# Patient Record
Sex: Female | Born: 1980 | Race: White | Hispanic: No | Smoking: Former smoker
Health system: Southern US, Community
[De-identification: ages and names within clinical notes are randomized; demographics above are authoritative.]

## PROBLEM LIST (undated history)

## (undated) DIAGNOSIS — R12 Heartburn: Secondary | ICD-10-CM

## (undated) DIAGNOSIS — O321XX0 Maternal care for breech presentation, not applicable or unspecified: Secondary | ICD-10-CM

## (undated) DIAGNOSIS — S52501A Unspecified fracture of the lower end of right radius, initial encounter for closed fracture: Secondary | ICD-10-CM

## (undated) HISTORY — PX: DENTAL SURGERY: SHX609

## (undated) HISTORY — PX: APPENDECTOMY: SHX54

---

## 1999-02-03 ENCOUNTER — Other Ambulatory Visit: Admission: RE | Admit: 1999-02-03 | Discharge: 1999-02-03 | Payer: Self-pay | Admitting: Family Medicine

## 1999-10-23 ENCOUNTER — Inpatient Hospital Stay (HOSPITAL_COMMUNITY): Admission: EM | Admit: 1999-10-23 | Discharge: 1999-10-24 | Payer: Self-pay | Admitting: Emergency Medicine

## 2004-01-27 ENCOUNTER — Other Ambulatory Visit: Admission: RE | Admit: 2004-01-27 | Discharge: 2004-01-27 | Payer: Self-pay | Admitting: Family Medicine

## 2004-12-23 ENCOUNTER — Ambulatory Visit (HOSPITAL_COMMUNITY): Admission: RE | Admit: 2004-12-23 | Discharge: 2004-12-23 | Payer: Self-pay | Admitting: Obstetrics and Gynecology

## 2005-06-29 ENCOUNTER — Other Ambulatory Visit: Admission: RE | Admit: 2005-06-29 | Discharge: 2005-06-29 | Payer: Self-pay | Admitting: Obstetrics and Gynecology

## 2005-07-14 ENCOUNTER — Inpatient Hospital Stay (HOSPITAL_COMMUNITY): Admission: RE | Admit: 2005-07-14 | Discharge: 2005-07-17 | Payer: Self-pay | Admitting: Obstetrics and Gynecology

## 2014-03-18 ENCOUNTER — Emergency Department (HOSPITAL_COMMUNITY): Payer: BLUE CROSS/BLUE SHIELD

## 2014-03-18 ENCOUNTER — Encounter (HOSPITAL_COMMUNITY): Payer: Self-pay

## 2014-03-18 ENCOUNTER — Other Ambulatory Visit (HOSPITAL_COMMUNITY): Payer: Self-pay | Admitting: Orthopaedic Surgery

## 2014-03-18 ENCOUNTER — Emergency Department (HOSPITAL_COMMUNITY)
Admission: EM | Admit: 2014-03-18 | Discharge: 2014-03-18 | Disposition: A | Discharge status: Home / Self Care | Payer: BLUE CROSS/BLUE SHIELD | Attending: Emergency Medicine | Admitting: Emergency Medicine

## 2014-03-18 DIAGNOSIS — Y9289 Other specified places as the place of occurrence of the external cause: Secondary | ICD-10-CM | POA: Insufficient documentation

## 2014-03-18 DIAGNOSIS — X58XXXA Exposure to other specified factors, initial encounter: Secondary | ICD-10-CM | POA: Diagnosis not present

## 2014-03-18 DIAGNOSIS — Z87891 Personal history of nicotine dependence: Secondary | ICD-10-CM | POA: Diagnosis not present

## 2014-03-18 DIAGNOSIS — W000XXA Fall on same level due to ice and snow, initial encounter: Secondary | ICD-10-CM | POA: Insufficient documentation

## 2014-03-18 DIAGNOSIS — S6991XA Unspecified injury of right wrist, hand and finger(s), initial encounter: Secondary | ICD-10-CM | POA: Diagnosis present

## 2014-03-18 DIAGNOSIS — Y998 Other external cause status: Secondary | ICD-10-CM | POA: Insufficient documentation

## 2014-03-18 DIAGNOSIS — Y9389 Activity, other specified: Secondary | ICD-10-CM | POA: Insufficient documentation

## 2014-03-18 DIAGNOSIS — S52501A Unspecified fracture of the lower end of right radius, initial encounter for closed fracture: Secondary | ICD-10-CM | POA: Diagnosis not present

## 2014-03-18 MED ORDER — FENTANYL CITRATE 0.05 MG/ML IJ SOLN
100.0000 ug | Freq: Once | INTRAMUSCULAR | Status: DC
  Filled 2014-03-18: qty 2

## 2014-03-18 MED ORDER — PERCOCET 5-325 MG PO TABS: 1.0000 | ORAL_TABLET | Freq: Four times a day (QID) | ORAL | Status: DC | PRN

## 2014-03-18 MED ORDER — FENTANYL CITRATE 0.05 MG/ML IJ SOLN
100.0000 ug | Freq: Once | INTRAMUSCULAR | Status: AC
  Administered 2014-03-18: 100 ug via INTRAVENOUS

## 2014-03-18 NOTE — ED Notes (Signed)
Per EMS- Patient slipped on ice and landed on her right wrist. Patient has an obvious deformity. No LOC or hitting of her head. Patient receiving Fentanyl 100 mcg IV. Pain initially 10/10 and decreased to 6/10.

## 2014-03-18 NOTE — Discharge Instructions (Signed)
Return here as needed. Follow up by calling Dr. Ophelia CharterYates for an appointment for later today. Elevate the wrist.

## 2014-03-18 NOTE — ED Provider Notes (Signed)
CSN: [id number]     Arrival date & time 03/18/14  [id number] History   First MD Initiated Contact with Patient 03/18/14 [phone]     Chief Complaint  Patient presents with  . Fall  . Wrist Injury     (Consider location/radiation/quality/duration/timing/severity/associated sxs/prior Treatment) HPI Patient presents to the emergency department on a fall that occurred prior to arrival.  Patient states she slipped on ice landing on her right wrist.  The patient states that she felt a pop in her wrist.  The patient states that EMS gave her fentanyl and an IV that relieves her pain somewhat.  The patient states that she did not hit her head or lose consciousness.  Patient denies headache, blurred vision, weakness, dizziness, numbness, back pain, neck pain, shortness of breath, lightheadedness, or syncope.  The patient states that movement and palpation make the pain worse. History reviewed. No pertinent past medical history. Past Surgical History  Procedure Laterality Date  . Appendectomy    . Cesarean section    . Dental surgery     Family History  Problem Relation Age of Onset  . Diabetes Father   . Hypertension Mother    History  Substance Use Topics  . Smoking status: Former Games developermoker  . Smokeless tobacco: Never Used  . Alcohol Use: Yes     Comment: rarely   OB History    No data available     Review of Systems  All other systems negative except as documented in the HPI. All pertinent positives and negatives as reviewed in the HPI.  Allergies  Review of patient's allergies indicates no known allergies.  Home Medications   Prior to Admission medications   Medication Sig Start Date End Date Taking? Authorizing Provider  famotidine (PEPCID AC) 10 MG chewable tablet Chew 10 mg by mouth 2 (two) times daily as needed for heartburn.   Yes Historical Provider, MD   BP 91/67 mmHg  Pulse 53  Temp(Src) 97.4 F (36.3 C) (Oral)  Resp 14  Ht 5\' 6"  (1.676 m)  Wt 190 lb (86.183 kg)  BMI 30.68  kg/m2  SpO2 97%  LMP 03/03/2014 Physical Exam  Constitutional: She is oriented to person, place, and time. She appears well-developed and well-nourished. No distress.  HENT:  Head: Normocephalic and atraumatic.  Mouth/Throat: Oropharynx is clear and moist.  Eyes: Pupils are equal, round, and reactive to light.  Cardiovascular: Normal rate, regular rhythm and normal heart sounds.  Exam reveals no gallop and no friction rub.   No murmur heard. Pulmonary/Chest: Effort normal and breath sounds normal. No respiratory distress.  Musculoskeletal:       Right wrist: She exhibits decreased range of motion, tenderness, bony tenderness, swelling and deformity.  Neurological: She is alert and oriented to person, place, and time. She exhibits normal muscle tone. Coordination normal.  Skin: Skin is warm and dry. No rash noted. No erythema.  Nursing note and vitals reviewed.   ED Course  Procedures (including critical care time)  Imaging Review No results found for this or any previous visit. Dg Wrist Complete Right  03/18/2014   CLINICAL DATA:  Acute right wrist pain after falling on ice today. Initial encounter.  EXAM: RIGHT WRIST - COMPLETE 3+ VIEW  COMPARISON:  None.  FINDINGS: Comminuted fracture of the distal right radius with posterior displacement of distal fragments is noted. Intra-articular extension is noted. This appears to be closed and posttraumatic.  IMPRESSION: Comminuted and posteriorly displaced distal right radial fracture.  Electronically Signed   By: Roque Lias M.D.   On: 03/18/2014 10:57   Ct Head Wo Contrast  03/18/2014   CLINICAL DATA:  Patient slipped on ice while walking on the sidewalk. Right wrist pain. No loss of consciousness. Hit head. No head or cervical complaints.  EXAM: CT HEAD WITHOUT CONTRAST  CT CERVICAL SPINE WITHOUT CONTRAST  TECHNIQUE: Multidetector CT imaging of the head and cervical spine was performed following the standard protocol without intravenous  contrast. Multiplanar CT image reconstructions of the cervical spine were also generated.  COMPARISON:  None.  FINDINGS: CT HEAD FINDINGS  There is no evidence of mass effect, midline shift or extra-axial fluid collections. There is no evidence of a space-occupying lesion or intracranial hemorrhage. There is no evidence of a cortical-based area of acute infarction.  The ventricles and sulci are appropriate for the patient's age. The basal cisterns are patent.  Visualized portions of the orbits are unremarkable. The visualized portions of the paranasal sinuses and mastoid air cells are unremarkable.  The osseous structures are unremarkable.  CT CERVICAL SPINE FINDINGS  The alignment is anatomic. The vertebral body heights are maintained. There is loss of the normal cervical lordosis with mild reversal. There is no acute fracture. There is no static listhesis. The prevertebral soft tissues are normal. The intraspinal soft tissues are not fully imaged on this examination due to poor soft tissue contrast, but there is no gross soft tissue abnormality.  The disc spaces are maintained. There is a mild broad-based disc bulge at C6-7.  The visualized portions of the lung apices demonstrate no focal abnormality.  IMPRESSION: 1. No acute intracranial pathology. 2. No acute osseous injury of the cervical spine.   Electronically Signed   By: Elige Ko   On: 03/18/2014 11:03   Ct Cervical Spine Wo Contrast  03/18/2014   CLINICAL DATA:  Patient slipped on ice while walking on the sidewalk. Right wrist pain. No loss of consciousness. Hit head. No head or cervical complaints.  EXAM: CT HEAD WITHOUT CONTRAST  CT CERVICAL SPINE WITHOUT CONTRAST  TECHNIQUE: Multidetector CT imaging of the head and cervical spine was performed following the standard protocol without intravenous contrast. Multiplanar CT image reconstructions of the cervical spine were also generated.  COMPARISON:  None.  FINDINGS: CT HEAD FINDINGS  There is no  evidence of mass effect, midline shift or extra-axial fluid collections. There is no evidence of a space-occupying lesion or intracranial hemorrhage. There is no evidence of a cortical-based area of acute infarction.  The ventricles and sulci are appropriate for the patient's age. The basal cisterns are patent.  Visualized portions of the orbits are unremarkable. The visualized portions of the paranasal sinuses and mastoid air cells are unremarkable.  The osseous structures are unremarkable.  CT CERVICAL SPINE FINDINGS  The alignment is anatomic. The vertebral body heights are maintained. There is loss of the normal cervical lordosis with mild reversal. There is no acute fracture. There is no static listhesis. The prevertebral soft tissues are normal. The intraspinal soft tissues are not fully imaged on this examination due to poor soft tissue contrast, but there is no gross soft tissue abnormality.  The disc spaces are maintained. There is a mild broad-based disc bulge at C6-7.  The visualized portions of the lung apices demonstrate no focal abnormality.  IMPRESSION: 1. No acute intracranial pathology. 2. No acute osseous injury of the cervical spine.   Electronically Signed   By: Elige Ko  On: 03/18/2014 11:03    Patient is placed in a sugar tongue splint.  I spoke with Dr. Ophelia Charter about the patient who will follow-up in his office today.  The patient is advised she will most likely need surgery.  Patient agrees the plan and all questions were answered.  She is also advised to elevate the wrist   Carlyle Dolly, PA-C 03/18/14 1218  Suzi Roots, MD 03/18/14 [phone]

## 2014-03-18 NOTE — ED Notes (Signed)
Bed: WA06 Expected date:  Expected time:  Means of arrival:  Comments: ems 

## 2014-03-20 ENCOUNTER — Encounter (HOSPITAL_COMMUNITY): Payer: Self-pay | Admitting: *Deleted

## 2014-03-20 MED ORDER — CEFAZOLIN SODIUM-DEXTROSE 2-3 GM-% IV SOLR
2.0000 g | INTRAVENOUS | Status: AC
  Administered 2014-03-21: 2 g via INTRAVENOUS
  Filled 2014-03-20: qty 50

## 2014-03-20 NOTE — H&P (Signed)
  [facility]   A Division of [facility]   [address], [place], [place] [address] Telephone: [phone]  Fax: [phone]     PATIENT: Megan [name], Megan [name]   MR#: [id number]  DOB: Nov 09, 1980   Visit Date: 03/18/2014     A 34 year old white female who is a new patient to the office who is being seen for a right distal radius fracture.  Patient states that this morning while [facility] [employer] [facility] [facility], she slipped onto an outstretched right arm.  She had severe pain in the wrist.  She went to the emergency room and x-rays were taken, which showed a comminuted and displaced right distal radius fracture.  She is put in a splint and instructed to follow up in our office.  No other injuries.   PAST MEDICAL/SURGICAL HISTORY:  Appendectomy and C-section.   CURRENT MEDICATIONS:  None listed.   ALLERGIES:  NKDA.   FAMILY HISTORY:  Positive diabetes, hypertension.   SOCIAL HISTORY:  Patient is single, but has a domestic partner.  Is a Consulting civil engineerstudent at [employer].  Quit smoking, but previously had a 15-year history.  Admits rare alcohol consumption.     PHYSICAL EXAMINATION:  Height 5 feet 6 inches.  Weight 190 pounds.  Very pleasant white female, alert and oriented x3 and in no acute distress.  Gait is normal.  Head is normocephalic atraumatic.  Cervical spine unremarkable.  Right upper extremity:  She does have a splint on that is intact.  Wearing a sling also.  She moves fingers well.  Good capillary refill.  Sensation intact.  Skin warm and dry.  No increase in respiratory effort.   RADIOGRAPHS:  [facility].  She has a comminuted displaced distal radius fracture.     ASSESSMENT:  Right distal radius fracture.   PLAN:  Patient advised the best treatment option at this point would be open reduction internal fixation procedure.  Surgical procedure discussed with patient.  All questions answered.   For additional information please see  handwritten notes, reports, orders and prescriptions in this chart.   Electronically Approved by:    Zonia KiefJames Jacobo Moncrief, PA Veverly FellsMark C. Ophelia CharterYates, M.D.    Auto-Authenticated by Zonia KiefJames Kris No, PA  JO/sm DD: 03/18/2014  DT: 03/19/2014

## 2014-03-20 NOTE — Progress Notes (Signed)
Pt denies SOB, chest pain, and being under the care of a cardiologist. Pt denies having a chest x ray and EKG within the last year. Pt denies having a stress test, echo, and cardiac cath. Pt made aware to stop taking Aspirin, vitamins , and herbal medications. Do not take any NSAIDs ie: Ibuprofen, Advil, Naproxen or any medication containing Aspirin. 

## 2014-03-21 ENCOUNTER — Ambulatory Visit (HOSPITAL_COMMUNITY)
Admission: RE | Admit: 2014-03-21 | Discharge: 2014-03-21 | Disposition: A | Discharge status: Home / Self Care | Payer: BLUE CROSS/BLUE SHIELD | Source: Ambulatory Visit | Attending: Orthopaedic Surgery | Admitting: Orthopaedic Surgery

## 2014-03-21 ENCOUNTER — Ambulatory Visit (HOSPITAL_COMMUNITY): Payer: BLUE CROSS/BLUE SHIELD | Admitting: Certified Registered Nurse Anesthetist

## 2014-03-21 ENCOUNTER — Encounter (HOSPITAL_COMMUNITY): Payer: Self-pay | Admitting: *Deleted

## 2014-03-21 ENCOUNTER — Encounter (HOSPITAL_COMMUNITY)
Admission: RE | Disposition: A | Discharge status: Home / Self Care | Payer: Self-pay | Source: Ambulatory Visit | Attending: Orthopaedic Surgery

## 2014-03-21 DIAGNOSIS — Y9389 Activity, other specified: Secondary | ICD-10-CM | POA: Insufficient documentation

## 2014-03-21 DIAGNOSIS — Y998 Other external cause status: Secondary | ICD-10-CM | POA: Diagnosis not present

## 2014-03-21 DIAGNOSIS — W010XXA Fall on same level from slipping, tripping and stumbling without subsequent striking against object, initial encounter: Secondary | ICD-10-CM | POA: Insufficient documentation

## 2014-03-21 DIAGNOSIS — S52591A Other fractures of lower end of right radius, initial encounter for closed fracture: Secondary | ICD-10-CM | POA: Diagnosis not present

## 2014-03-21 DIAGNOSIS — S52501A Unspecified fracture of the lower end of right radius, initial encounter for closed fracture: Secondary | ICD-10-CM | POA: Diagnosis present

## 2014-03-21 DIAGNOSIS — Y92214 College as the place of occurrence of the external cause: Secondary | ICD-10-CM | POA: Diagnosis not present

## 2014-03-21 DIAGNOSIS — Z87891 Personal history of nicotine dependence: Secondary | ICD-10-CM | POA: Insufficient documentation

## 2014-03-21 HISTORY — DX: Heartburn: R12

## 2014-03-21 HISTORY — PX: OPEN REDUCTION INTERNAL FIXATION (ORIF) DISTAL RADIAL FRACTURE: SHX5989

## 2014-03-21 HISTORY — DX: Unspecified fracture of the lower end of right radius, initial encounter for closed fracture: S52.501A

## 2014-03-21 LAB — CBC
HEMATOCRIT: 41.6 % (ref 36.0–46.0)
Hemoglobin: 14.5 g/dL (ref 12.0–15.0)
MCH: 29.8 pg (ref 26.0–34.0)
MCHC: 34.9 g/dL (ref 30.0–36.0)
MCV: 85.6 fL (ref 78.0–100.0)
Platelets: 319 10*3/uL (ref 150–400)
RBC: 4.86 MIL/uL (ref 3.87–5.11)
RDW: 12.8 % (ref 11.5–15.5)
WBC: 7.7 10*3/uL (ref 4.0–10.5)

## 2014-03-21 LAB — COMPREHENSIVE METABOLIC PANEL
ALT: 44 U/L — ABNORMAL HIGH (ref 0–35)
AST: 30 U/L (ref 0–37)
Albumin: 3.7 g/dL (ref 3.5–5.2)
Alkaline Phosphatase: 57 U/L (ref 39–117)
Anion gap: 3 — ABNORMAL LOW (ref 5–15)
BUN: 10 mg/dL (ref 6–23)
CO2: 29 mmol/L (ref 19–32)
Calcium: 9.1 mg/dL (ref 8.4–10.5)
Chloride: 106 mmol/L (ref 96–112)
Creatinine, Ser: 0.89 mg/dL (ref 0.50–1.10)
GFR calc Af Amer: >90 mL/min (ref >90)
GFR calc non Af Amer: 83 mL/min — ABNORMAL LOW (ref >90)
Glucose, Bld: 103 mg/dL — ABNORMAL HIGH (ref 70–99)
POTASSIUM: 4.1 mmol/L (ref 3.5–5.1)
Sodium: 138 mmol/L (ref 135–145)
TOTAL PROTEIN: 7.2 g/dL (ref 6.0–8.3)
Total Bilirubin: 0.7 mg/dL (ref 0.3–1.2)

## 2014-03-21 LAB — HCG, SERUM, QUALITATIVE: Preg, Serum: NEGATIVE

## 2014-03-21 SURGERY — OPEN REDUCTION INTERNAL FIXATION (ORIF) DISTAL RADIUS FRACTURE
Anesthesia: Regional | Laterality: Right

## 2014-03-21 MED ORDER — HYDROMORPHONE HCL 1 MG/ML IJ SOLN
INTRAMUSCULAR | Status: AC
  Administered 2014-03-21: 0.5 mg via INTRAVENOUS
  Filled 2014-03-21: qty 1

## 2014-03-21 MED ORDER — ONDANSETRON HCL 4 MG/2ML IJ SOLN
INTRAMUSCULAR | Status: AC
  Filled 2014-03-21: qty 2

## 2014-03-21 MED ORDER — CALCIUM CARBONATE ANTACID 500 MG PO CHEW
800.0000 mg | CHEWABLE_TABLET | Freq: Once | ORAL | Status: AC
  Administered 2014-03-21: 800 mg via ORAL
  Filled 2014-03-21: qty 4

## 2014-03-21 MED ORDER — FENTANYL CITRATE 0.05 MG/ML IJ SOLN
50.0000 ug | INTRAMUSCULAR | Status: DC | PRN
  Administered 2014-03-21: 100 ug via INTRAVENOUS
  Filled 2014-03-21: qty 2

## 2014-03-21 MED ORDER — MIDAZOLAM HCL 2 MG/2ML IJ SOLN
INTRAMUSCULAR | Status: AC
  Filled 2014-03-21: qty 2

## 2014-03-21 MED ORDER — 0.9 % SODIUM CHLORIDE (POUR BTL) OPTIME
TOPICAL | Status: DC | PRN
  Administered 2014-03-21: 1000 mL

## 2014-03-21 MED ORDER — MIDAZOLAM HCL 5 MG/5ML IJ SOLN
INTRAMUSCULAR | Status: DC | PRN
  Administered 2014-03-21: 1 mg via INTRAVENOUS

## 2014-03-21 MED ORDER — FENTANYL CITRATE 0.05 MG/ML IJ SOLN
INTRAMUSCULAR | Status: DC | PRN
  Administered 2014-03-21: 100 ug via INTRAVENOUS
  Administered 2014-03-21: 50 ug via INTRAVENOUS

## 2014-03-21 MED ORDER — LIDOCAINE HCL (CARDIAC) 20 MG/ML IV SOLN
INTRAVENOUS | Status: AC
  Filled 2014-03-21: qty 5

## 2014-03-21 MED ORDER — BUPIVACAINE-EPINEPHRINE (PF) 0.5% -1:200000 IJ SOLN
INTRAMUSCULAR | Status: DC | PRN
  Administered 2014-03-21: 30 mL via PERINEURAL

## 2014-03-21 MED ORDER — LACTATED RINGERS IV SOLN
INTRAVENOUS | Status: DC
  Administered 2014-03-21: 12:00:00 via INTRAVENOUS

## 2014-03-21 MED ORDER — PROPOFOL 10 MG/ML IV BOLUS
INTRAVENOUS | Status: DC | PRN
  Administered 2014-03-21: 150 mg via INTRAVENOUS

## 2014-03-21 MED ORDER — DEXAMETHASONE SODIUM PHOSPHATE 4 MG/ML IJ SOLN
INTRAMUSCULAR | Status: AC
  Filled 2014-03-21: qty 6

## 2014-03-21 MED ORDER — PROMETHAZINE HCL 25 MG/ML IJ SOLN: 6.2500 mg | INTRAMUSCULAR | Status: DC | PRN

## 2014-03-21 MED ORDER — NEOSTIGMINE METHYLSULFATE 10 MG/10ML IV SOLN
INTRAVENOUS | Status: AC
  Filled 2014-03-21: qty 1

## 2014-03-21 MED ORDER — HYDROMORPHONE HCL 1 MG/ML IJ SOLN
0.2500 mg | INTRAMUSCULAR | Status: DC | PRN
  Administered 2014-03-21 (×2): 0.5 mg via INTRAVENOUS

## 2014-03-21 MED ORDER — MIDAZOLAM HCL 2 MG/2ML IJ SOLN
1.0000 mg | INTRAMUSCULAR | Status: DC | PRN
  Administered 2014-03-21: 1 mg via INTRAVENOUS
  Filled 2014-03-21: qty 2

## 2014-03-21 MED ORDER — LACTATED RINGERS IV SOLN
INTRAVENOUS | Status: DC | PRN
  Administered 2014-03-21 (×2): via INTRAVENOUS

## 2014-03-21 MED ORDER — LIDOCAINE HCL (CARDIAC) 20 MG/ML IV SOLN
INTRAVENOUS | Status: DC | PRN
  Administered 2014-03-21: 100 mg via INTRAVENOUS

## 2014-03-21 MED ORDER — PROPOFOL 10 MG/ML IV BOLUS
INTRAVENOUS | Status: AC
  Filled 2014-03-21: qty 20

## 2014-03-21 MED ORDER — FENTANYL CITRATE 0.05 MG/ML IJ SOLN
INTRAMUSCULAR | Status: AC
  Filled 2014-03-21: qty 5

## 2014-03-21 MED ORDER — SUCCINYLCHOLINE CHLORIDE 20 MG/ML IJ SOLN
INTRAMUSCULAR | Status: AC
  Filled 2014-03-21: qty 1

## 2014-03-21 SURGICAL SUPPLY — 53 items
BANDAGE ELASTIC 3 VELCRO ST LF (GAUZE/BANDAGES/DRESSINGS) ×3 IMPLANT
BANDAGE ELASTIC 4 VELCRO ST LF (GAUZE/BANDAGES/DRESSINGS) ×3 IMPLANT
BIT DRILL 2 FAST STEP (BIT) ×3 IMPLANT
BIT DRILL 2.5X4 QC (BIT) ×3 IMPLANT
BNDG ESMARK 4X9 LF (GAUZE/BANDAGES/DRESSINGS) IMPLANT
CLOSURE WOUND 1/2 X4 (GAUZE/BANDAGES/DRESSINGS) ×1
COVER SURGICAL LIGHT HANDLE (MISCELLANEOUS) ×3 IMPLANT
DRAPE OEC MINIVIEW 54X84 (DRAPES) IMPLANT
DRSG PAD ABDOMINAL 8X10 ST (GAUZE/BANDAGES/DRESSINGS) IMPLANT
DURAPREP 26ML APPLICATOR (WOUND CARE) ×3 IMPLANT
ELECT REM PT RETURN 9FT ADLT (ELECTROSURGICAL) ×3
ELECTRODE REM PT RTRN 9FT ADLT (ELECTROSURGICAL) ×1 IMPLANT
GAUZE SPONGE 4X4 12PLY STRL (GAUZE/BANDAGES/DRESSINGS) IMPLANT
GAUZE XEROFORM 1X8 LF (GAUZE/BANDAGES/DRESSINGS) ×3 IMPLANT
GLOVE BIOGEL PI IND STRL 8 (GLOVE) ×1 IMPLANT
GLOVE BIOGEL PI INDICATOR 8 (GLOVE) ×2
GLOVE ECLIPSE 7.0 STRL STRAW (GLOVE) ×3 IMPLANT
GLOVE ORTHO TXT STRL SZ7.5 (GLOVE) ×3 IMPLANT
GLOVE SURG SS PI 6.0 STRL IVOR (GLOVE) ×3 IMPLANT
GOWN STRL REUS W/ TWL LRG LVL3 (GOWN DISPOSABLE) ×1 IMPLANT
GOWN STRL REUS W/TWL LRG LVL3 (GOWN DISPOSABLE) ×2
K-WIRE 1.6 (WIRE) ×2
K-WIRE FX5X1.6XNS BN SS (WIRE) ×1
KIT BASIN OR (CUSTOM PROCEDURE TRAY) ×3 IMPLANT
KIT ROOM TURNOVER OR (KITS) ×3 IMPLANT
KWIRE FX5X1.6XNS BN SS (WIRE) ×1 IMPLANT
MANIFOLD NEPTUNE II (INSTRUMENTS) ×3 IMPLANT
NEEDLE 22X1 1/2 (OR ONLY) (NEEDLE) IMPLANT
NS IRRIG 1000ML POUR BTL (IV SOLUTION) ×3 IMPLANT
PACK ORTHO EXTREMITY (CUSTOM PROCEDURE TRAY) ×3 IMPLANT
PAD ARMBOARD 7.5X6 YLW CONV (MISCELLANEOUS) ×6 IMPLANT
PAD CAST 4YDX4 CTTN HI CHSV (CAST SUPPLIES) ×2 IMPLANT
PADDING CAST COTTON 4X4 STRL (CAST SUPPLIES) ×4
PEG SUBCHONDRAL SMOOTH 2.0X14 (Peg) ×3 IMPLANT
PEG SUBCHONDRAL SMOOTH 2.0X18 (Peg) ×6 IMPLANT
PEG SUBCHONDRAL SMOOTH 2.0X20 (Peg) ×12 IMPLANT
PLATE STAN 24.4X59.5 RT (Plate) ×3 IMPLANT
SCREW BN 12X3.5XNS CORT TI (Screw) ×2 IMPLANT
SCREW CORT 3.5X12 (Screw) ×4 IMPLANT
SPONGE GAUZE 4X4 12PLY STER LF (GAUZE/BANDAGES/DRESSINGS) ×3 IMPLANT
STRIP CLOSURE SKIN 1/2X4 (GAUZE/BANDAGES/DRESSINGS) ×2 IMPLANT
SUT PROLENE 3 0 PS 1 (SUTURE) ×3 IMPLANT
SUT VIC AB 2-0 CT1 27 (SUTURE)
SUT VIC AB 2-0 CT1 TAPERPNT 27 (SUTURE) IMPLANT
SUT VIC AB 3-0 X1 27 (SUTURE) ×3 IMPLANT
SUT VICRYL 4-0 PS2 18IN ABS (SUTURE) IMPLANT
SYR CONTROL 10ML LL (SYRINGE) IMPLANT
TOWEL OR 17X24 6PK STRL BLUE (TOWEL DISPOSABLE) ×3 IMPLANT
TOWEL OR 17X26 10 PK STRL BLUE (TOWEL DISPOSABLE) ×3 IMPLANT
TUBE CONNECTING 12'X1/4 (SUCTIONS) ×1
TUBE CONNECTING 12X1/4 (SUCTIONS) ×2 IMPLANT
UNDERPAD 30X30 INCONTINENT (UNDERPADS AND DIAPERS) ×3 IMPLANT
WATER STERILE IRR 1000ML POUR (IV SOLUTION) ×3 IMPLANT

## 2014-03-21 NOTE — Transfer of Care (Signed)
Immediate Anesthesia Transfer of Care Note  Patient: Megan [name]  Procedure(s) Performed: Procedure(s): OPEN REDUCTION INTERNAL FIXATION (ORIF) DISTAL RADIAL FRACTURE (Right)  Patient Location: PACU  Anesthesia Type:GA combined with regional for post-op pain  Level of Consciousness: awake, alert , oriented and patient cooperative  Airway & Oxygen Therapy: Patient Spontanous Breathing and Patient connected to nasal cannula oxygen  Post-op Assessment: Report given to RN, Post -op Vital signs reviewed and stable and Patient moving all extremities  Post vital signs: Reviewed and stable  Last Vitals:  Filed Vitals:   03/21/14 1245  BP: 132/63  Pulse: 85  Temp:   Resp: 14    Complications: No apparent anesthesia complications

## 2014-03-21 NOTE — Anesthesia Postprocedure Evaluation (Signed)
Anesthesia Post Note  Patient: Megan FreshwaterMelissa A Michie  Procedure(s) Performed: Procedure(s) (LRB): OPEN REDUCTION INTERNAL FIXATION (ORIF) DISTAL RADIAL FRACTURE (Right)  Anesthesia type: general  Patient location: PACU  Post pain: Pain level controlled  Post assessment: Patient's Cardiovascular Status Stable  Last Vitals:  Filed Vitals:   03/21/14 1639  BP: 141/85  Pulse: 89  Temp:   Resp: 16    Post vital signs: Reviewed and stable  Level of consciousness: sedated  Complications: No apparent anesthesia complications

## 2014-03-21 NOTE — Progress Notes (Signed)
Orthopedic Tech Progress Note Patient Details:  Loralyn FreshwaterMelissa A Wadhwa [birth year] [id number]  Ortho Devices Type of Ortho Device: Arm sling Ortho Device/Splint Location: rue Ortho Device/Splint Interventions: Application   Ranell Finelli 03/21/2014, 4:07 PM

## 2014-03-21 NOTE — Discharge Summary (Signed)
  Patient not admitted. Does not need a discharge summary.

## 2014-03-21 NOTE — Interval H&P Note (Signed)
History and Physical Interval Note:  03/21/2014 12:21 PM  Megan [name]  has presented today for surgery, with the diagnosis of Right Distal Radius Fracture  The various methods of treatment have been discussed with the patient and family. After consideration of risks, benefits and other options for treatment, the patient has consented to  Procedure(s): OPEN REDUCTION INTERNAL FIXATION (ORIF) DISTAL RADIAL FRACTURE (Right) as a surgical intervention .  The patient's history has been reviewed, patient examined, no change in status, stable for surgery.  I have reviewed the patient's chart and labs.  Questions were answered to the patient's satisfaction.     Arlin Savona C

## 2014-03-21 NOTE — Discharge Instructions (Signed)
Hand elevated above heart , sling only when ambulating. Office one week percocet for pain

## 2014-03-21 NOTE — Progress Notes (Signed)
Dr. Jacklynn BueMassagee in to see pt while xray staff in to take pt for CXR. CXR discontinued by Dr. Jacklynn BueMassagee

## 2014-03-21 NOTE — Anesthesia Procedure Notes (Addendum)
Anesthesia Regional Block:  Supraclavicular block  Pre-Anesthetic Checklist: ,, timeout performed, Correct Patient, Correct Site, Correct Laterality, Correct Procedure, Correct Position, site marked, Risks and benefits discussed,  Surgical consent,  Pre-op evaluation,  At surgeon's request and post-op pain management  Laterality: Right and Upper  Prep: chloraprep       Needles:   Needle Type: Echogenic Stimulator Needle     Needle Length: 9cm 9 cm Needle Gauge: 21 and 21 G  Needle insertion depth: 4 cm   Additional Needles:  Procedures: ultrasound guided (picture in chart) and nerve stimulator Supraclavicular block Narrative:  Start time: 03/21/2014 12:10 PM End time: 03/21/2014 12:25 PM Injection made incrementally with aspirations every 5 mL.  Performed by: Personally  Anesthesiologist: MASSAGEE, TERRY  Additional Notes: Tolerated well, no picture available   Procedure Name: LMA Insertion Date/Time: 03/21/2014 2:11 PM Performed by: Rogelia BogaMUELLER, Shwanda Soltis P Pre-anesthesia Checklist: Patient identified, Emergency Drugs available, Suction available, Patient being monitored and Timeout performed Patient Re-evaluated:Patient Re-evaluated prior to inductionOxygen Delivery Method: Circle system utilized Preoxygenation: Pre-oxygenation with 100% oxygen Intubation Type: IV induction LMA Size: 4.0 Number of attempts: 1 Placement Confirmation: positive ETCO2 and breath sounds checked- equal and bilateral Tube secured with: Tape Dental Injury: Teeth and Oropharynx as per pre-operative assessment

## 2014-03-21 NOTE — Anesthesia Preprocedure Evaluation (Signed)
Anesthesia Evaluation  Patient identified by MRN, date of birth, ID band Patient awake    Reviewed: Allergy & Precautions, NPO status , Patient's Chart, lab work & pertinent test results  History of Anesthesia Complications Negative for: history of anesthetic complications  Airway Mallampati: I       Dental  (+) Teeth Intact   Pulmonary neg pulmonary ROS, former smoker,  breath sounds clear to auscultation        Cardiovascular negative cardio ROS  Rhythm:Regular Rate:Normal     Neuro/Psych    GI/Hepatic negative GI ROS, Neg liver ROS,   Endo/Other  negative endocrine ROS  Renal/GU negative Renal ROS     Musculoskeletal   Abdominal   Peds  Hematology negative hematology ROS (+)   Anesthesia Other Findings   Reproductive/Obstetrics                             Anesthesia Physical Anesthesia Plan  ASA: I  Anesthesia Plan: General   Post-op Pain Management:    Induction: Intravenous  Airway Management Planned: LMA  Additional Equipment:   Intra-op Plan:   Post-operative Plan: Extubation in OR  Informed Consent: I have reviewed the patients History and Physical, chart, labs and discussed the procedure including the risks, benefits and alternatives for the proposed anesthesia with the patient or authorized representative who has indicated his/her understanding and acceptance.     Plan Discussed with: CRNA and Surgeon  Anesthesia Plan Comments:         Anesthesia Quick Evaluation

## 2014-03-21 NOTE — Brief Op Note (Signed)
03/21/2014  3:10 PM  PATIENT:  Megan [name]  34 y.o. female  PRE-OPERATIVE DIAGNOSIS:  Right Distal Radius Fracture  POST-OPERATIVE DIAGNOSIS:  Right Distal Radius Fracture  PROCEDURE:  Procedure(s): OPEN REDUCTION INTERNAL FIXATION (ORIF) DISTAL RADIAL FRACTURE (Right)  SURGEON:  Surgeon(s) and Role:    * Eldred MangesMark C Holleigh Crihfield, MD - Primary  PHYSICIAN ASSISTANT:   ASSISTANTS: none   ANESTHESIA:   regional and general  EBL:  Total I/O In: 1000 [I.V.:1000] Out: -   BLOOD ADMINISTERED:none  DRAINS: none   LOCAL MEDICATIONS USED:  NONE  SPECIMEN:  No Specimen  DISPOSITION OF SPECIMEN:  N/A  COUNTS:  YES  TOURNIQUET:   Total Tourniquet Time Documented: Upper Arm (Right) - 29 minutes Total: Upper Arm (Right) - 29 minutes   DICTATION: .Other Dictation: Dictation Number [id number]  PLAN OF CARE: Discharge to home after PACU  PATIENT DISPOSITION:  PACU - hemodynamically stable.   Delay start of Pharmacological VTE agent (>24hrs) due to surgical blood loss or risk of bleeding: not applicable

## 2014-03-22 NOTE — Op Note (Signed)
[name]:  [name], Megan             ACCOUNT NO.:  [id number]  MEDICAL RECORD NO.:  [id number]  LOCATION:  [facility]                         FACILITY:  [facility]  PHYSICIAN:  Hilda Wexler C. Ophelia CharterYates, M.D.    DATE OF BIRTH:  10/16/1980  DATE OF PROCEDURE:  03/21/2014 DATE OF DISCHARGE:  03/21/2014                              OPERATIVE REPORT   PREOPERATIVE DIAGNOSIS:  Right distal radius fracture.  POSTOPERATIVE DIAGNOSIS:  Right distal radius fracture.  PROCEDURE:  Open reduction and internal fixation of right distal radius volar DVR plating.  SURGEON:  Livianna Petraglia C. Ophelia CharterYates, MD  ANESTHESIA:  Regional plus general.  TOURNIQUET TIME:  29 minutes.  DRAINS:  None.  BRIEF HISTORY:  A 34 year old female fell with a severely displaced distal radius fracture angulation and displacement.  Extension into the distal radial ulnar joint with 2 distal intra-articular fragments plus the proximal fragment.  DESCRIPTION OF PROCEDURE:  After preoperative block, standard prepping and draping with general anesthesia, proximal arm tourniquet had been applied.  DuraPrep, usual extremity sheets and drapes, stockinette had been applied.  Time-out procedure.  Incision was made after sterile skin marker was used, wrapping with an Esmarch, and tourniquet inflation at 250.  Incision was made directly over the FCR posterior sheath, was divided after pulling the FCR towards the radial artery for protection. Posterior sheath was opened and the pronator was pulled off the radial aspect of the radius towards the ulna exposing the fracture site and volar surface of the distal radius.  Standard right plate was selected. Fracture was reduced with distraction displacing the fragment and then reducing it in anatomic position.  Plate was applied.  K-wire was placed in the whole midportion of the plate and then checked under fluoroscopy. Second pin was then placed through the proximal hole, small pins through the small K-wire hole which  confirmed that was in good position.  Would not violate the joint and all pins and pegs would catch the distal fragment holding it reduced.  Proximal 2 screws were placed, purple 12 mm screws after drilling depth gauge measurement.  Smooth pegs were placed starting distal row going from the ulnar to the radial aspect and then filling the proximal peg rolls all with smooth pegs.  Final spot pictures were taken, AP, lateral, and tangential views confirming anatomic position, plate and screws were in good position.  All screws were locked down.  Irrigation with saline solution, tourniquet deflation. 2-0 Vicryl in subcutaneous tissue, 3-0 Vicryl subcuticular closure.  Tincture of benzoin, Steri-Strips, postop splint, tweeners, 4x4s, Webril, dorsal splint, more Webril, and Ace wrap was applied.  The patient will be discharged home since she has had an excellent block.     Tysheem Accardo C. Ophelia CharterYates, M.D.     MCY/MEDQ  D:  03/21/2014  T:  03/22/2014  Job:  [id number]

## 2014-03-24 ENCOUNTER — Encounter (HOSPITAL_COMMUNITY): Payer: Self-pay | Admitting: Orthopaedic Surgery

## 2014-05-12 ENCOUNTER — Inpatient Hospital Stay (HOSPITAL_COMMUNITY)
Admission: EM | Admit: 2014-05-12 | Discharge: 2014-05-12 | Disposition: A | Discharge status: Home / Self Care | Payer: BLUE CROSS/BLUE SHIELD | Attending: Obstetrics and Gynecology | Admitting: Obstetrics and Gynecology

## 2014-05-12 ENCOUNTER — Encounter (HOSPITAL_COMMUNITY): Payer: Self-pay

## 2014-05-12 ENCOUNTER — Inpatient Hospital Stay (HOSPITAL_COMMUNITY): Payer: BLUE CROSS/BLUE SHIELD

## 2014-05-12 DIAGNOSIS — R55 Syncope and collapse: Secondary | ICD-10-CM

## 2014-05-12 DIAGNOSIS — O039 Complete or unspecified spontaneous abortion without complication: Secondary | ICD-10-CM

## 2014-05-12 DIAGNOSIS — R42 Dizziness and giddiness: Secondary | ICD-10-CM | POA: Insufficient documentation

## 2014-05-12 DIAGNOSIS — O209 Hemorrhage in early pregnancy, unspecified: Secondary | ICD-10-CM

## 2014-05-12 DIAGNOSIS — O26899 Other specified pregnancy related conditions, unspecified trimester: Secondary | ICD-10-CM

## 2014-05-12 DIAGNOSIS — Z87891 Personal history of nicotine dependence: Secondary | ICD-10-CM | POA: Diagnosis not present

## 2014-05-12 DIAGNOSIS — Z3A10 10 weeks gestation of pregnancy: Secondary | ICD-10-CM | POA: Insufficient documentation

## 2014-05-12 DIAGNOSIS — R109 Unspecified abdominal pain: Secondary | ICD-10-CM

## 2014-05-12 DIAGNOSIS — N939 Abnormal uterine and vaginal bleeding, unspecified: Secondary | ICD-10-CM

## 2014-05-12 LAB — HIV ANTIBODY (ROUTINE TESTING W REFLEX)
HIV Screen 4th Generation wRfx: NONREACTIVE
HIV Screen 4th Generation wRfx: NONREACTIVE

## 2014-05-12 LAB — CBC
HCT: 29.7 % — ABNORMAL LOW (ref 36.0–46.0)
HCT: 27.6 % — ABNORMAL LOW (ref 36.0–46.0)
Hemoglobin: 10.3 g/dL — ABNORMAL LOW (ref 12.0–15.0)
Hemoglobin: 9.7 g/dL — ABNORMAL LOW (ref 12.0–15.0)
MCH: 29.9 pg (ref 26.0–34.0)
MCH: 30.4 pg (ref 26.0–34.0)
MCHC: 34.7 g/dL (ref 30.0–36.0)
MCHC: 35.1 g/dL (ref 30.0–36.0)
MCV: 86.3 fL (ref 78.0–100.0)
MCV: 86.5 fL (ref 78.0–100.0)
PLATELETS: 336 10*3/uL (ref 150–400)
Platelets: 326 10*3/uL (ref 150–400)
RBC: 3.44 MIL/uL — ABNORMAL LOW (ref 3.87–5.11)
RBC: 3.19 MIL/uL — ABNORMAL LOW (ref 3.87–5.11)
RDW: 13.1 % (ref 11.5–15.5)
RDW: 13.2 % (ref 11.5–15.5)
WBC: 15.1 10*3/uL — AB (ref 4.0–10.5)
WBC: 16.2 10*3/uL — AB (ref 4.0–10.5)

## 2014-05-12 LAB — CBC WITH DIFFERENTIAL/PLATELET
BASOS ABS: 0.1 10*3/uL (ref 0.0–0.1)
Basophils Relative: 0 % (ref 0–1)
Eosinophils Absolute: 0.1 10*3/uL (ref 0.0–0.7)
Eosinophils Relative: 0 % (ref 0–5)
HCT: 34.5 % — ABNORMAL LOW (ref 36.0–46.0)
Hemoglobin: 12 g/dL (ref 12.0–15.0)
Lymphocytes Relative: 12 % (ref 12–46)
Lymphs Abs: 2.8 10*3/uL (ref 0.7–4.0)
MCH: 30.4 pg (ref 26.0–34.0)
MCHC: 34.8 g/dL (ref 30.0–36.0)
MCV: 87.3 fL (ref 78.0–100.0)
Monocytes Absolute: 1 10*3/uL (ref 0.1–1.0)
Monocytes Relative: 4 % (ref 3–12)
Neutro Abs: 20 10*3/uL — ABNORMAL HIGH (ref 1.7–7.7)
Neutrophils Relative %: 84 % — ABNORMAL HIGH (ref 43–77)
PLATELETS: 429 10*3/uL — AB (ref 150–400)
RBC: 3.95 MIL/uL (ref 3.87–5.11)
RDW: 13.1 % (ref 11.5–15.5)
WBC: 24 10*3/uL — AB (ref 4.0–10.5)

## 2014-05-12 LAB — SAMPLE TO BLOOD BANK

## 2014-05-12 LAB — I-STAT BETA HCG BLOOD, ED (MC, WL, AP ONLY)

## 2014-05-12 LAB — BASIC METABOLIC PANEL
ANION GAP: 9 (ref 5–15)
BUN: 16 mg/dL (ref 6–23)
CHLORIDE: 102 mmol/L (ref 96–112)
CO2: 23 mmol/L (ref 19–32)
CREATININE: 0.89 mg/dL (ref 0.50–1.10)
Calcium: 8.8 mg/dL (ref 8.4–10.5)
GFR calc non Af Amer: 83 mL/min — ABNORMAL LOW (ref >90)
Glucose, Bld: 131 mg/dL — ABNORMAL HIGH (ref 70–99)
Potassium: 3.3 mmol/L — ABNORMAL LOW (ref 3.5–5.1)
SODIUM: 134 mmol/L — AB (ref 135–145)

## 2014-05-12 LAB — WET PREP, GENITAL
Clue Cells Wet Prep HPF POC: NONE SEEN
Trich, Wet Prep: NONE SEEN
Yeast Wet Prep HPF POC: NONE SEEN

## 2014-05-12 LAB — TYPE AND SCREEN
ABO/RH(D): A POS
Antibody Screen: NEGATIVE

## 2014-05-12 LAB — HCG, QUANTITATIVE, PREGNANCY: HCG, BETA CHAIN, QUANT, S: 4517 m[IU]/mL — AB (ref <5)

## 2014-05-12 LAB — OB RESULTS CONSOLE GBS: GBS: NEGATIVE

## 2014-05-12 LAB — ABO/RH: ABO/RH(D): A POS

## 2014-05-12 MED ORDER — SODIUM CHLORIDE 0.9 % IV BOLUS (SEPSIS)
1000.0000 mL | Freq: Once | INTRAVENOUS | Status: AC
  Administered 2014-05-12: 1000 mL via INTRAVENOUS

## 2014-05-12 MED ORDER — MISOPROSTOL 200 MCG PO TABS
800.0000 ug | ORAL_TABLET | Freq: Once | ORAL | Status: AC
  Administered 2014-05-12: 800 ug via RECTAL
  Filled 2014-05-12: qty 4

## 2014-05-12 MED ORDER — HYDROCODONE-ACETAMINOPHEN 5-325 MG PO TABS: 1.0000 | ORAL_TABLET | ORAL | Status: DC | PRN

## 2014-05-12 MED ORDER — ONDANSETRON HCL 4 MG/2ML IJ SOLN
4.0000 mg | Freq: Once | INTRAMUSCULAR | Status: AC
  Administered 2014-05-12: 4 mg via INTRAVENOUS
  Filled 2014-05-12: qty 2

## 2014-05-12 MED ORDER — SODIUM CHLORIDE 0.9 % IV SOLN: INTRAVENOUS | Status: DC

## 2014-05-12 MED ORDER — HYDROMORPHONE HCL 1 MG/ML IJ SOLN
1.0000 mg | Freq: Once | INTRAMUSCULAR | Status: AC
  Administered 2014-05-12: 1 mg via INTRAVENOUS
  Filled 2014-05-12: qty 1

## 2014-05-12 MED ORDER — MISOPROSTOL 200 MCG PO TABS: 800.0000 ug | ORAL_TABLET | Freq: Once | ORAL | Status: DC

## 2014-05-12 MED ORDER — ONDANSETRON 4 MG PO TBDP: 4.0000 mg | ORAL_TABLET | Freq: Three times a day (TID) | ORAL | Status: DC | PRN

## 2014-05-12 NOTE — ED Provider Notes (Signed)
CSN: [id number]     Arrival date & time 05/12/14  0230 History   First MD Initiated Contact with Patient 05/12/14 0245     Chief Complaint  Patient presents with  . Vaginal Bleeding     (Consider location/radiation/quality/duration/timing/severity/associated sxs/prior Treatment) HPI 34 year old female presents to the emergency department from home with complaint of heavy vaginal bleeding.  Bleeding started yesterday morning around 11 AM.  She reports that she has been going through 2 tampons an hour.  Patient woke up around 1 AM and on the way to the bathroom, felt very dizzy, lightheaded and hot.  She had nausea and vomiting.  Patient was helped to the bathroom by her husband, and afterwards got into the tub.  By her description it sounds that she threw up and then passed out in the tub.  Patient reports her last normal menstrual period was in January.  Patient had a fracture of her right arm in surgery, and attributed her missed period in February 2 stressors of the surgery.  She reports she had some light spotting 2 weeks ago.  She reports she has had weight gain, which she attributed to not moving around much.  After the surgery.  Patient reports one prior pregnancy, no complications.  She reports she has had some lower abdominal cramping which are slightly worse, and menstrual cramps. Past Medical History  Diagnosis Date  . Distal radius fracture, right   . Heartburn    Past Surgical History  Procedure Laterality Date  . Appendectomy    . Cesarean section    . Dental surgery    . Open reduction internal fixation (orif) distal radial fracture Right 03/21/2014    Procedure: OPEN REDUCTION INTERNAL FIXATION (ORIF) DISTAL RADIAL FRACTURE;  Surgeon: Eldred MangesMark C Yates, MD;  Location: [facility] OR;  Service: Orthopedics;  Laterality: Right;   Family History  Problem Relation Age of Onset  . Diabetes Father   . Hypertension Mother    History  Substance Use Topics  . Smoking status: Former Smoker   Types: Cigarettes  . Smokeless tobacco: Never Used     Comment: Quit December 2015  . Alcohol Use: Yes     Comment: rarely   OB History    No data available     Review of Systems  See History of Present Illness; otherwise all other systems are reviewed and negative   Allergies  Review of patient's allergies indicates no known allergies.  Home Medications   Prior to Admission medications   Medication Sig Start Date End Date Taking? Authorizing Provider  famotidine (PEPCID AC) 10 MG chewable tablet Chew 10 mg by mouth 2 (two) times daily as needed for heartburn.   Yes Historical Provider, MD  naproxen sodium (ANAPROX) 220 MG tablet Take 220 mg by mouth once.   Yes Historical Provider, MD  PERCOCET 5-325 MG per tablet Take 1 tablet by mouth every 6 (six) hours as needed for severe pain. Patient not taking: Reported on 05/12/2014 03/18/14   Charlestine Nighthristopher Lawyer, PA-C   BP 110/69 mmHg  Pulse 112  Temp(Src) 97.4 F (36.3 C) (Oral)  Resp 12  SpO2 98%  LMP 05/12/2014 Physical Exam  Constitutional: She is oriented to person, place, and time. She appears well-developed and well-nourished. She appears distressed.  HENT:  Head: Normocephalic and atraumatic.  Nose: Nose normal.  Mouth/Throat: Oropharynx is clear and moist.  Eyes: Conjunctivae and EOM are normal. Pupils are equal, round, and reactive to light.  Neck: Normal range of  motion. Neck supple. No JVD present. No tracheal deviation present. No thyromegaly present.  Cardiovascular: Normal rate, regular rhythm, normal heart sounds and intact distal pulses.  Exam reveals no gallop and no friction rub.   No murmur heard. Pulmonary/Chest: Effort normal and breath sounds normal. No stridor. No respiratory distress. She has no wheezes. She has no rales. She exhibits no tenderness.  Abdominal: Soft. Bowel sounds are normal. She exhibits no distension and no mass. There is no tenderness. There is no rebound and no guarding.  Genitourinary:   External genitalia within normal limits Vagina heavy vaginal bleeding with clots Cervix  os is open with large clot in the office bleeding continuously around the clot, filling the vagina  negative for cervical motion tenderness.  Diffuse tenderness with palpation    Musculoskeletal: Normal range of motion. She exhibits no edema or tenderness.  Lymphadenopathy:    She has no cervical adenopathy.  Neurological: She is alert and oriented to person, place, and time. She displays normal reflexes. She exhibits normal muscle tone. Coordination normal.  Skin: Skin is warm and dry. No rash noted. No erythema. No pallor.  Psychiatric: She has a normal mood and affect. Her behavior is normal. Judgment and thought content normal.  Nursing note and vitals reviewed.   ED Course  Procedures (including critical care time) Labs Review Labs Reviewed  CBC WITH DIFFERENTIAL/PLATELET - Abnormal; Notable for the following:    WBC 24.0 (*)    HCT 34.5 (*)    Platelets 429 (*)    Neutrophils Relative % 84 (*)    Neutro Abs 20.0 (*)    All other components within normal limits  BASIC METABOLIC PANEL - Abnormal; Notable for the following:    Sodium 134 (*)    Potassium 3.3 (*)    Glucose, Bld 131 (*)    GFR calc non Af Amer 83 (*)    All other components within normal limits  I-STAT BETA HCG BLOOD, ED ([facility], [facility], [facility] ONLY) - Abnormal; Notable for the following:    I-stat hCG, quantitative >2000.0 (*)    All other components within normal limits  URINALYSIS, ROUTINE W REFLEX MICROSCOPIC  HIV ANTIBODY (ROUTINE TESTING)  HCG, QUANTITATIVE, PREGNANCY  SAMPLE TO BLOOD BANK  GC/CHLAMYDIA PROBE AMP ([place])    Imaging Review No results found.   EKG Interpretation None     CRITICAL CARE Performed by: Olivia Mackie Total critical care time: 60 min Critical care time was exclusive of separately billable procedures and treating other patients. Critical care was necessary to treat or prevent  imminent or life-threatening deterioration. Critical care was time spent personally by me on the following activities: development of treatment plan with patient and/or surrogate as well as nursing, discussions with consultants, evaluation of patient's response to treatment, examination of patient, obtaining history from patient or surrogate, ordering and performing treatments and interventions, ordering and review of laboratory studies, ordering and review of radiographic studies, pulse oximetry and re-evaluation of patient's condition.  MDM   Final diagnoses:  Vaginal bleeding in pregnancy, first trimester  Syncope, unspecified syncope type  Episode of heavy vaginal bleeding    34 year old female with heavy vaginal bleeding.  Her beta hCG is positive, greater than 2000.  Bleeding from miscarriage versus ectopic pregnancy.  Patient is significantly orthostatic.  Case discussed with Dr. Jolayne Panther, on call for OB and she accepts the patient in transfer over to women's MAU.  Patient's hemoglobin at this time is 12.  She is  receiving IV fluids.  Patient updated on findings and plan.    Marisa Severin, MD 05/12/14 [phone]

## 2014-05-12 NOTE — MAU Note (Signed)
Heavy vaginal bleeding since Saturday evening. LMP 03/03/14. Didn't know she was pregnant. Mild cramping this weekend.

## 2014-05-12 NOTE — ED Notes (Signed)
Attempted to obtain a urine specimen from pt however specimen had several large blood clots remaining urine had frank bright red blood.

## 2014-05-12 NOTE — MAU Provider Note (Signed)
History     CSN: [id number]  Arrival date and time: 05/12/14 0230   First Provider Initiated Contact with Patient 05/12/14 [phone]      Chief Complaint  Patient presents with  . Vaginal Bleeding   HPI [name] is a 34 y.o. G3P1011 at suspected [redacted]w[redacted]d pregnancy by LMP here with report of vaginal bleeding that started Saturday that she felt was her menstrual period.  Bleeding increased today and patient reports saturating multiple tampons and passing large clots; the bleeding is described as heavy.  Patient states that she loss consciousness while in the shower.  Went to [facility] where blood was drawn and it was discovered she was pregnant.  Patient surprised by pregnancy, not planned.  Pt sent to [facility] for further evaluation.  Pt reports feeling some lower abdominal cramping, but it's "not that bad".  None at this time. She has not taken anything for the pain.   Patient's last menstrual period was 03/03/2014 (exact date). Condoms for birth control method.    Past Medical History  Diagnosis Date  . Distal radius fracture, right   . Heartburn     Past Surgical History  Procedure Laterality Date  . Appendectomy    . Cesarean section    . Dental surgery    . Open reduction internal fixation (orif) distal radial fracture Right 03/21/2014    Procedure: OPEN REDUCTION INTERNAL FIXATION (ORIF) DISTAL RADIAL FRACTURE;  Surgeon: Eldred Manges, MD;  Location: [facility] OR;  Service: Orthopedics;  Laterality: Right;    Family History  Problem Relation Age of Onset  . Diabetes Father   . Hypertension Mother     History  Substance Use Topics  . Smoking status: Former Smoker    Types: Cigarettes  . Smokeless tobacco: Never Used     Comment: Quit December 2015  . Alcohol Use: Yes     Comment: rarely    Allergies: No Known Allergies  Prescriptions prior to admission  Medication Sig Dispense Refill Last Dose  . famotidine (PEPCID AC) 10 MG chewable tablet Chew 10  mg by mouth 2 (two) times daily as needed for heartburn.   Past Month at Unknown time  . naproxen sodium (ANAPROX) 220 MG tablet Take 220 mg by mouth once.   05/11/2014 at Unknown time    Review of Systems  Constitutional: Negative for fever and chills.  Cardiovascular: Negative for palpitations.  Gastrointestinal: Positive for nausea, vomiting and abdominal pain (cramping).  Genitourinary:       Vaginal bleeding  Neurological: Positive for dizziness and loss of consciousness.  All other systems reviewed and are negative.  Physical Exam   Blood pressure 128/80, pulse 112, temperature 98.7 F (37.1 C), temperature source Oral, resp. rate 16, height  (1.676 m), weight 88.451 kg (195 lb), last menstrual period 03/03/2014, SpO2 99 %.  Physical Exam  Constitutional: She is oriented to person, place, and time. She appears well-developed and well-nourished.  HENT:  Head: Normocephalic.  Neck: Normal range of motion. Neck supple.  Cardiovascular: Regular rhythm and normal heart sounds.  Exam reveals no gallop and no friction rub.   No murmur heard. Respiratory: Effort normal and breath sounds normal. No respiratory distress.  GI: Soft. There is no tenderness (suprapubic).  Genitourinary: There is bleeding (moderate; +clots in vagina) in the vagina.  Neurological: She is alert and oriented to person, place, and time.  Skin: Skin is warm and dry.    MAU Course  Procedures  Results for orders placed or performed during the hospital encounter of 05/12/14 (from the past 24 hour(s))  OB RESULT CONSOLE Group B Strep     Status: None   Collection Time: 05/12/14 12:00 AM  Result Value Ref Range   GBS Negative   CBC with Differential/Platelet     Status: Abnormal   Collection Time: 05/12/14  3:45 AM  Result Value Ref Range   WBC 24.0 (H) 4.0 - 10.5 K/uL   RBC 3.95 3.87 - 5.11 MIL/uL   Hemoglobin 12.0 12.0 - 15.0 g/dL   HCT 96.2 (L) 95.2 - 84.1 %   MCV 87.3 78.0 - 100.0 fL   MCH 30.4  26.0 - 34.0 pg   MCHC 34.8 30.0 - 36.0 g/dL   RDW 32.4 40.1 - 02.7 %   Platelets 429 (H) 150 - 400 K/uL   Neutrophils Relative % 84 (H) 43 - 77 %   Neutro Abs 20.0 (H) 1.7 - 7.7 K/uL   Lymphocytes Relative 12 12 - 46 %   Lymphs Abs 2.8 0.7 - 4.0 K/uL   Monocytes Relative 4 3 - 12 %   Monocytes Absolute 1.0 0.1 - 1.0 K/uL   Eosinophils Relative 0 0 - 5 %   Eosinophils Absolute 0.1 0.0 - 0.7 K/uL   Basophils Relative 0 0 - 1 %   Basophils Absolute 0.1 0.0 - 0.1 K/uL  Basic metabolic panel     Status: Abnormal   Collection Time: 05/12/14  3:45 AM  Result Value Ref Range   Sodium 134 (L) 135 - 145 mmol/L   Potassium 3.3 (L) 3.5 - 5.1 mmol/L   Chloride 102 96 - 112 mmol/L   CO2 23 19 - 32 mmol/L   Glucose, Bld 131 (H) 70 - 99 mg/dL   BUN 16 6 - 23 mg/dL   Creatinine, Ser 2.53 0.50 - 1.10 mg/dL   Calcium 8.8 8.4 - 66.4 mg/dL   GFR calc non Af Amer 83 (L) >90 mL/min   GFR calc Af Amer >90 >90 mL/min   Anion gap 9 5 - 15  HIV antibody     Status: None   Collection Time: 05/12/14  3:45 AM  Result Value Ref Range   HIV Screen 4th Generation wRfx Non Reactive Non Reactive  hCG, quantitative, pregnancy     Status: Abnormal   Collection Time: 05/12/14  3:45 AM  Result Value Ref Range   hCG, Beta Chain, Quant, S 4517 (H) <5 mIU/mL  I-Stat beta hCG blood, ED     Status: Abnormal   Collection Time: 05/12/14  3:51 AM  Result Value Ref Range   I-stat hCG, quantitative >2000.0 (H) <5 mIU/mL   Comment 3          Sample to Blood Bank     Status: None   Collection Time: 05/12/14  4:35 AM  Result Value Ref Range   Blood Bank Specimen SAMPLE AVAILABLE FOR TESTING    Sample Expiration 05/15/2014   Wet prep, genital     Status: Abnormal   Collection Time: 05/12/14  6:40 AM  Result Value Ref Range   Yeast Wet Prep HPF POC NONE SEEN NONE SEEN   Trich, Wet Prep NONE SEEN NONE SEEN   Clue Cells Wet Prep HPF POC NONE SEEN NONE SEEN   WBC, Wet Prep HPF POC FEW (A) NONE SEEN  Type and screen      Status: None   Collection Time: 05/12/14  6:50 AM  Result Value  Ref Range   ABO/RH(D) A POS    Antibody Screen NEG    Sample Expiration 05/15/2014   HIV antibody     Status: None   Collection Time: 05/12/14  6:50 AM  Result Value Ref Range   HIV Screen 4th Generation wRfx Non Reactive Non Reactive  CBC     Status: Abnormal   Collection Time: 05/12/14  6:50 AM  Result Value Ref Range   WBC 15.1 (H) 4.0 - 10.5 K/uL   RBC 3.44 (L) 3.87 - 5.11 MIL/uL   Hemoglobin 10.3 (L) 12.0 - 15.0 g/dL   HCT 13.029.7 (L) 86.536.0 - 78.446.0 %   MCV 86.3 78.0 - 100.0 fL   MCH 29.9 26.0 - 34.0 pg   MCHC 34.7 30.0 - 36.0 g/dL   RDW 69.613.1 29.511.5 - 28.415.5 %   Platelets 326 150 - 400 K/uL  ABO/Rh     Status: None   Collection Time: 05/12/14  6:50 AM  Result Value Ref Range   ABO/RH(D) A POS   CBC     Status: Abnormal   Collection Time: 05/12/14 10:50 AM  Result Value Ref Range   WBC 16.2 (H) 4.0 - 10.5 K/uL   RBC 3.19 (L) 3.87 - 5.11 MIL/uL   Hemoglobin 9.7 (L) 12.0 - 15.0 g/dL   HCT 13.227.6 (L) 44.036.0 - 10.246.0 %   MCV 86.5 78.0 - 100.0 fL   MCH 30.4 26.0 - 34.0 pg   MCHC 35.1 30.0 - 36.0 g/dL   RDW 72.513.2 36.611.5 - 44.015.5 %   Platelets 336 150 - 400 K/uL   Ultrasound: IMPRESSION: No evidence of intrauterine gestation. There is a small amount of complex fluid within the endometrial canal.  No hypervascular adnexal mass identified.  The differential considerations include failed intrauterine pregnancy or very early intrauterine pregnancy. Given the positive beta HCG, an early ectopic pregnancy cannot be excluded. Recommend serial quantitative beta hCGs. If clinically warranted, repeat transvaginal imaging could be performed in 7-10 days.  0820 Pt reports continued dizziness.   [id number] Consulted with Dr. Jolayne Pantheronstant > reviewed HPI/exam/labs/vital > give cytotec 800 mcg rectallly and reassess  0830 Report given to J. Rasch who assumes care of patient.  Eino FarberWalidah Kennith GainN Karim, CNM   0845:Patient reports increased pain and nausea>  dilaudid 1 mg given IV and Zofran 4 mg IV given.  Cytotec placed by RN; consent signed   1030: patient up to the bathroom; unable to walk back to room due to dizziness. Dr. Erin FullingHarraway-Smith made aware> Repeat spec exam and CBC   11:30 small-moderate amount of tissue like material pulled from cervical os with kelly clamp. After 30 mins no blood was noted on patient's pad. Dr. Erin FullingHarraway-Smith notified; vitals stable and patient is not having any pain at this time.   Repeat hemoglobin 9.7  Assessment and Plan   A:  SAB in process.  Vaginal bleeding in pregnancy   P:  Discharge home in stable condition RX: Cytotec- patient to take in 8 hours        Zofran         vicodin  Follow up in the clinic on Friday for repeat beta hcg level Bleeding precautions  Specimen sent to pathology of possible POC  Return to MAU if symptoms worsen  Pelvic rest  Duane LopeJennifer I Rasch, NP 05/12/2014 3:38 PM

## 2014-05-12 NOTE — Progress Notes (Signed)
Patient states she feels like she is going to pass out, feels nauseated. Dry heaves. Color pale. IV rate increased.

## 2014-05-12 NOTE — Progress Notes (Signed)
Patient seems to feels better. No vomiting. Medicated for pain and nausea. Cytotec inserted.

## 2014-05-12 NOTE — Progress Notes (Signed)
Assisted to BR. Voided, passes mod amount of blood in toilet. Became slightly lightheaded prior to returning to room. Was taken to room in W/C.

## 2014-05-12 NOTE — ED Notes (Signed)
Pt presents with c/o heavy vaginal bleeding that has been going on since Saturday night, passing clots as well. Pt reports that the bleeding calmed down but picked back up yesterday morning. Pt reports she has been bleeding constantly since yesterday morning and has had episodes where she feels like she might pass out. Pt is ambulatory to triage, NAD at this time.

## 2015-03-17 ENCOUNTER — Encounter (HOSPITAL_COMMUNITY): Payer: Self-pay | Admitting: *Deleted

## 2015-12-23 IMAGING — US US OB TRANSVAGINAL
1 series · 13 of 28 positions shown · non-contrast
Comparison: Prior obstetrical ultrasound 12/23/2004

CLINICAL DATA: 33-year-old female reportedly 10 weeks pregnant by
last menstrual period. Quantitative beta HCG is greater than 5333.
She has been experiencing heavy bleeding with passage of clots.

EXAM:
OBSTETRIC <14 WK US AND TRANSVAGINAL OB US
TECHNIQUE: Both transabdominal and transvaginal ultrasound examinations were
performed for complete evaluation of the gestation as well as the
maternal uterus, adnexal regions, and pelvic cul-de-sac.
Transvaginal technique was performed to assess early pregnancy.

[Series 1: us ob transvaginal · 13 of 53 slices shown]
[im 2/53]
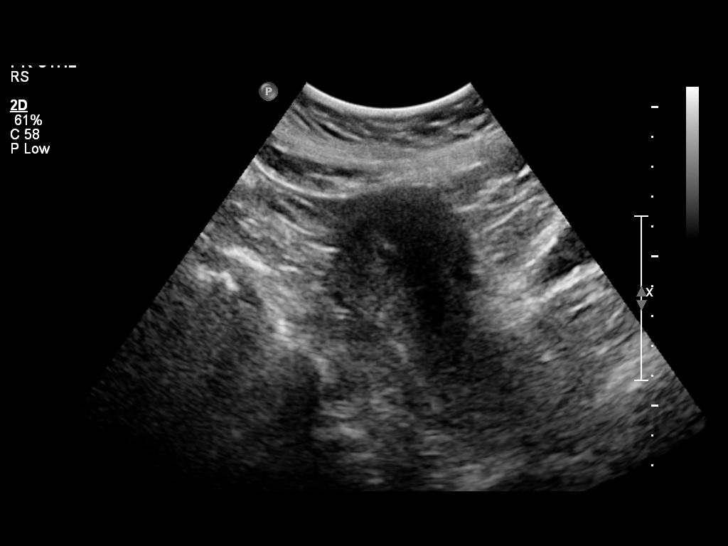
[im 6/53]
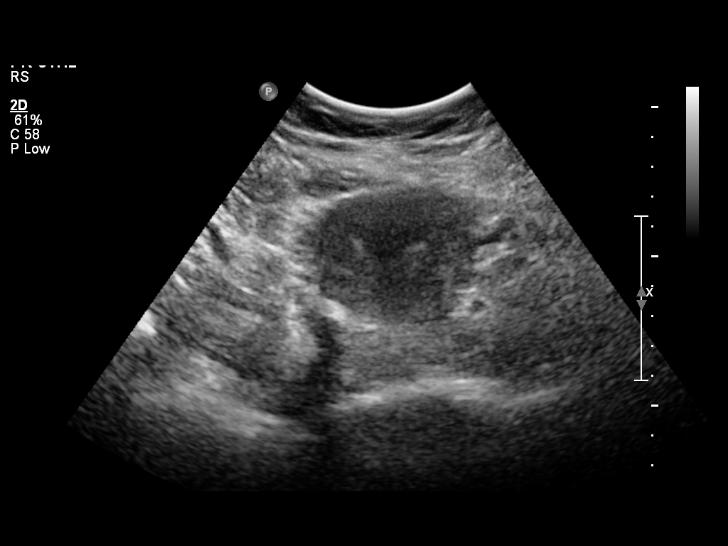
[im 10/53]
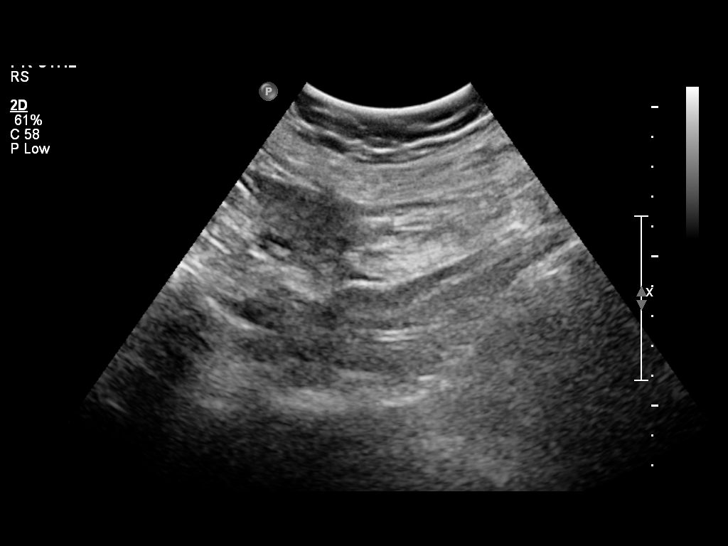
[im 14/53]
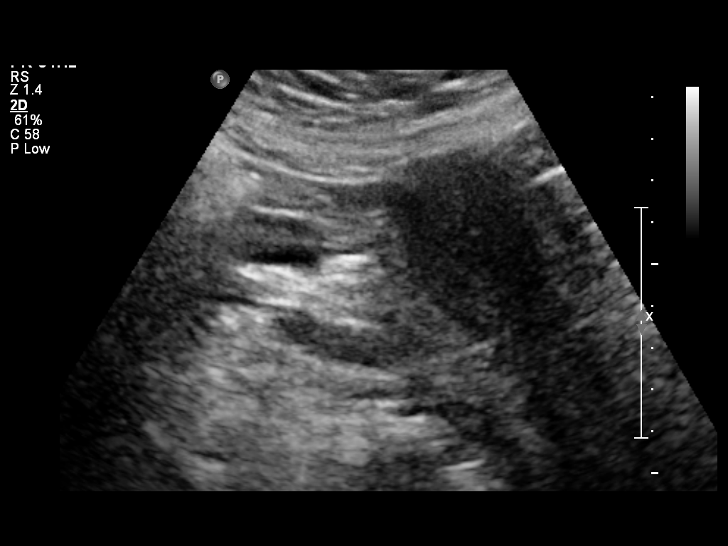
[im 18/53]
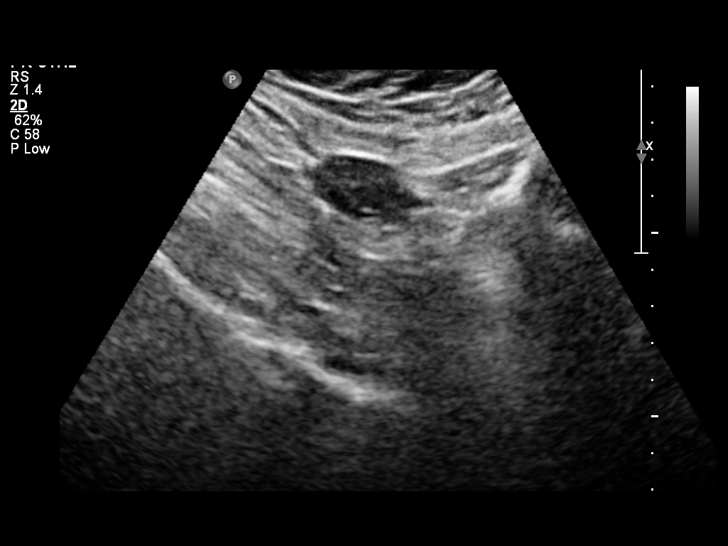
[im 22/53]
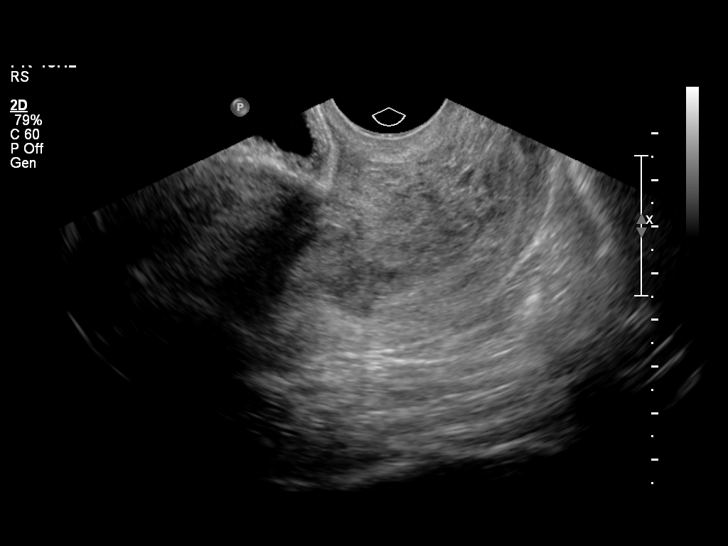
[im 27/53]
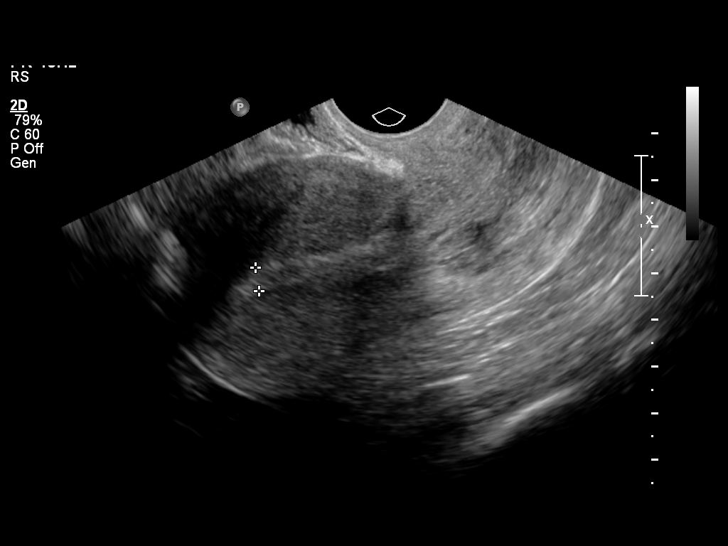
[im 31/53]
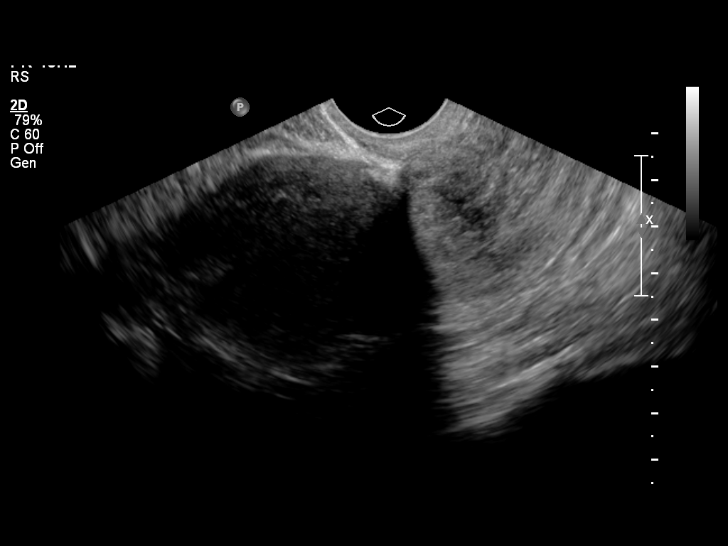
[im 35/53]
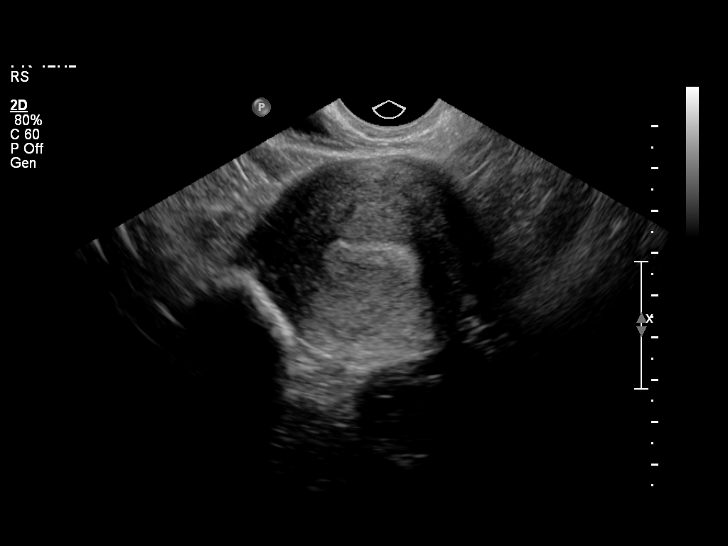
[im 39/53]
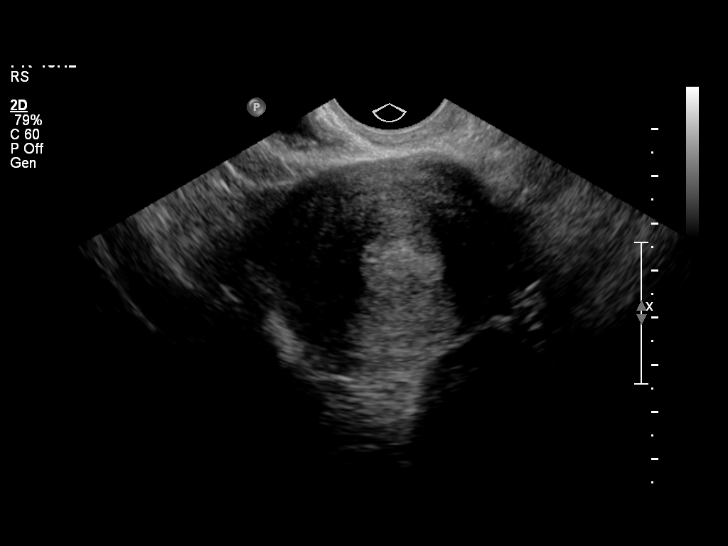
[im 43/53]
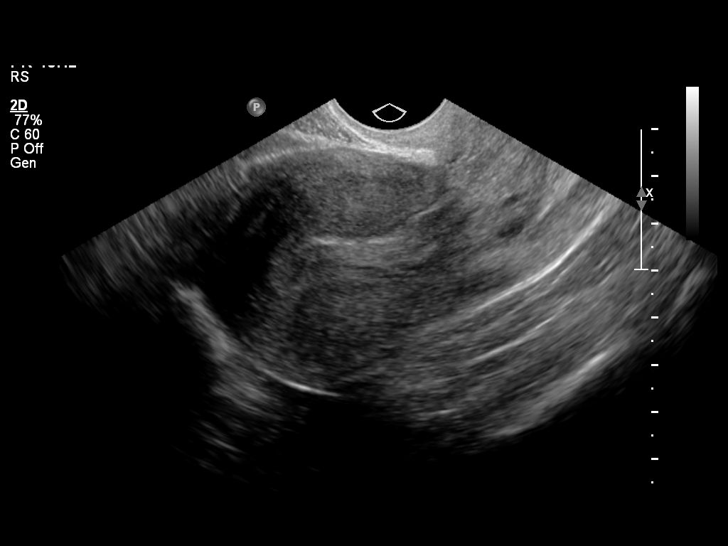
[im 47/53]
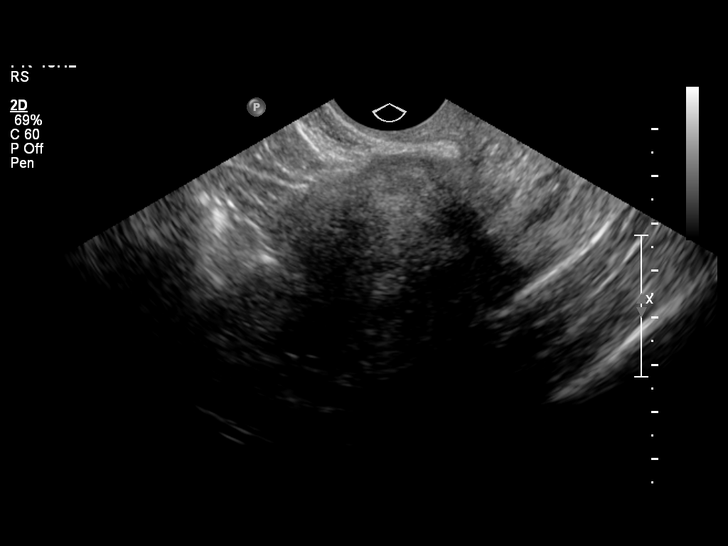
[im 51/53]
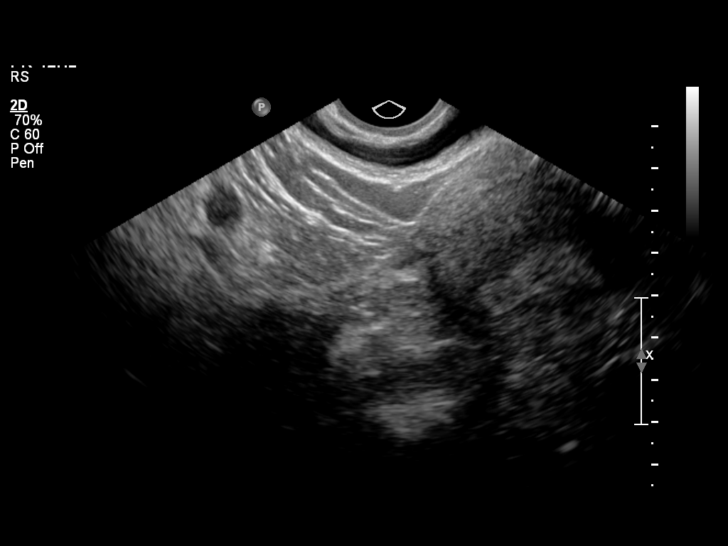

[13 of 28 positions shown; findings below may reference images not displayed]

FINDINGS: Intrauterine gestational sac: None visualized

Yolk sac:  Not present

Embryo:  Not present

Maternal uterus/adnexae: The bilateral ovaries are normal in
appearance. No evidence of free fluid. Small amount of heterogeneous
fluid present within the endometrial canal predominantly in the
distal uterine segment.
IMPRESSION: No evidence of intrauterine gestation. There is a small amount of
complex fluid within the endometrial canal.

No hypervascular adnexal mass identified.

The differential considerations include failed intrauterine
pregnancy or very early intrauterine pregnancy. Given the positive
beta HCG, an early ectopic pregnancy cannot be excluded. Recommend
serial quantitative beta hCGs. If clinically warranted, repeat
transvaginal imaging could be performed in 7-10 days.

## 2015-12-28 ENCOUNTER — Encounter (HOSPITAL_COMMUNITY): Payer: Self-pay

## 2015-12-28 ENCOUNTER — Emergency Department (HOSPITAL_COMMUNITY): Payer: BLUE CROSS/BLUE SHIELD

## 2015-12-28 DIAGNOSIS — W1839XA Other fall on same level, initial encounter: Secondary | ICD-10-CM | POA: Insufficient documentation

## 2015-12-28 DIAGNOSIS — Y929 Unspecified place or not applicable: Secondary | ICD-10-CM | POA: Diagnosis not present

## 2015-12-28 DIAGNOSIS — Y939 Activity, unspecified: Secondary | ICD-10-CM | POA: Diagnosis not present

## 2015-12-28 DIAGNOSIS — Z87891 Personal history of nicotine dependence: Secondary | ICD-10-CM | POA: Insufficient documentation

## 2015-12-28 DIAGNOSIS — M25571 Pain in right ankle and joints of right foot: Secondary | ICD-10-CM | POA: Diagnosis present

## 2015-12-28 DIAGNOSIS — Y999 Unspecified external cause status: Secondary | ICD-10-CM | POA: Insufficient documentation

## 2015-12-28 NOTE — ED Triage Notes (Signed)
Pt fell in a hole tonight twisting her ankle and she thinks she heard a pop, pt states that it's swollen and very painful

## 2015-12-29 ENCOUNTER — Emergency Department (HOSPITAL_COMMUNITY)
Admission: EM | Admit: 2015-12-29 | Discharge: 2015-12-29 | Disposition: A | Discharge status: Home / Self Care | Payer: BLUE CROSS/BLUE SHIELD | Attending: Emergency Medicine | Admitting: Emergency Medicine

## 2015-12-29 DIAGNOSIS — M25571 Pain in right ankle and joints of right foot: Secondary | ICD-10-CM

## 2015-12-29 MED ORDER — NAPROXEN 375 MG PO TABS: 375.0000 mg | ORAL_TABLET | Freq: Two times a day (BID) | ORAL | 0 refills | Status: DC

## 2015-12-29 NOTE — Discharge Instructions (Signed)
SEEK MEDICAL CARE IF:   You have rapidly increasing bruising or swelling.  Your toes feel extremely cold or you lose feeling in your foot.  Your pain is not relieved with medicine. SEEK IMMEDIATE MEDICAL CARE IF:  Your toes are numb or blue.  You have severe pain that is increasing.

## 2015-12-29 NOTE — ED Provider Notes (Signed)
[facility]-EMERGENCY DEPT Provider Note   CSN: [id number] Arrival date & time: 12/28/15  2252     History   Chief Complaint Chief Complaint  Patient presents with  . Ankle Pain    HPI  Megan [name] is a 35 y.o. female who complains of inversion injury to the right ankle 2 hours ago. There is pain and swelling at the lateral aspect of that ankle. The patient was not able to bear weight directly after the injury. She states she fell into a hole and heard her ankle pop. No previous injuries to this area.    Ankle Pain   The incident occurred 1 to 2 hours ago. The incident occurred in the yard. The injury mechanism was a fall. The pain is present in the right ankle. The pain is at a severity of 8/10. The pain is moderate. The pain has been improving since onset. Associated symptoms include inability to bear weight. Pertinent negatives include no numbness, no loss of motion, no muscle weakness, no loss of sensation and no tingling. The symptoms are aggravated by bearing weight and palpation. She has tried nothing for the symptoms.    Past Medical History:  Diagnosis Date  . Distal radius fracture, right   . Heartburn     Patient Active Problem List   Diagnosis Date Noted  . Distal radius fracture, right 03/21/2014    Past Surgical History:  Procedure Laterality Date  . APPENDECTOMY    . CESAREAN SECTION    . DENTAL SURGERY    . OPEN REDUCTION INTERNAL FIXATION (ORIF) DISTAL RADIAL FRACTURE Right 03/21/2014   Procedure: OPEN REDUCTION INTERNAL FIXATION (ORIF) DISTAL RADIAL FRACTURE;  Surgeon: Eldred MangesMark C Yates, MD;  Location: [facility] OR;  Service: Orthopedics;  Laterality: Right;    OB History    Gravida Para Term Preterm AB Living   3 1 1   1 1    SAB TAB Ectopic Multiple Live Births     1     1       Home Medications    Prior to Admission medications   Medication Sig Start Date End Date Taking? Authorizing Provider  famotidine (PEPCID AC) 10 MG chewable tablet Chew 10 mg by  mouth 2 (two) times daily as needed for heartburn.    Historical Provider, MD  HYDROcodone-acetaminophen (NORCO/VICODIN) 5-325 MG per tablet Take 1-2 tablets by mouth every 4 (four) hours as needed. 05/12/14   Duane LopeJennifer I Rasch, NP  misoprostol (CYTOTEC) 200 MCG tablet Place 4 tablets (800 mcg total) vaginally once. 05/12/14   Duane LopeJennifer I Rasch, NP  naproxen (NAPROSYN) 375 MG tablet Take 1 tablet (375 mg total) by mouth 2 (two) times daily. 12/29/15   Arthor CaptainAbigail Sriya Kroeze, PA-C  naproxen sodium (ANAPROX) 220 MG tablet Take 220 mg by mouth once.    Historical Provider, MD  ondansetron (ZOFRAN ODT) 4 MG disintegrating tablet Take 1 tablet (4 mg total) by mouth every 8 (eight) hours as needed for nausea or vomiting. 05/12/14   Duane LopeJennifer I Rasch, NP    Family History Family History  Problem Relation Age of Onset  . Diabetes Father   . Hypertension Mother     Social History Social History  Substance Use Topics  . Smoking status: Former Smoker    Types: Cigarettes  . Smokeless tobacco: Never Used     Comment: Quit December 2015  . Alcohol use Yes     Comment: rarely     Allergies   Patient has no known  allergies.   Review of Systems Review of Systems  Neurological: Negative for tingling and numbness.     Physical Exam Updated Vital Signs BP (!) 139/103 (BP Location: Right Arm)   Pulse 90   Temp 97.9 F (36.6 C) (Oral)   Resp 20   LMP 11/27/2015   SpO2 94%   Physical Exam  Constitutional: She is oriented to person, place, and time. She appears well-developed and well-nourished. No distress.  HENT:  Head: Normocephalic and atraumatic.  Eyes: Conjunctivae are normal. No scleral icterus.  Neck: Normal range of motion.  Cardiovascular: Normal rate, regular rhythm and normal heart sounds.  Exam reveals no gallop and no friction rub.   No murmur heard. Pulmonary/Chest: Effort normal and breath sounds normal. No respiratory distress.  Abdominal: Soft. Bowel sounds are normal. She  exhibits no distension and no mass. There is no tenderness. There is no guarding.  Musculoskeletal:  There is swelling and tenderness over the lateral malleolus. No tenderness over the medial aspect of the ankle. The fifth metatarsal is not tender. The ankle joint is intact without excessive opening on stressing. X-Ray shows fracture to be negative. The rest of the foot, ankle and leg exam is normal.    Neurological: She is alert and oriented to person, place, and time.  Skin: Skin is warm and dry. She is not diaphoretic.     ED Treatments / Results  Labs (all labs ordered are listed, but only abnormal results are displayed) Labs Reviewed - No data to display  EKG  EKG Interpretation None       Radiology Dg Ankle Complete Right  Result Date: 12/28/2015 CLINICAL DATA:  Right ankle pain at the lateral malleolus after a fall today. EXAM: RIGHT ANKLE - COMPLETE 3+ VIEW COMPARISON:  None. FINDINGS: Lateral soft tissue swelling about the right ankle. No evidence of acute fracture or dislocation. Ankle mortise and talar dome appear intact. No focal bone lesion or bone destruction. IMPRESSION: Lateral soft tissue swelling about the right ankle. No acute bony abnormalities. Electronically Signed   By: Burman NievesWilliam  Stevens M.D.   On: 12/28/2015 23:40    Procedures Procedures (including critical care time)  Medications Ordered in ED Medications - No data to display   Initial Impression / Assessment and Plan / ED Course  I have reviewed the triage vital signs and the nursing notes.  Pertinent labs & imaging results that were available during my care of the patient were reviewed by me and considered in my medical decision making (see chart for details).  Clinical Course     Patient X-Ray negative for obvious fracture or dislocation. Pain managed in ED.   Home Care: Rest and elevate the injured ankle, apply ice intermittently. Use crutches without weight bearing until able to comfortable  bear partial weight, then progress to full weight bearing as tolerated. Splint applied. See ortho prn.  Patient will be dc home & is agreeable with above plan.   Final Clinical Impressions(s) / ED Diagnoses   Final diagnoses:  Acute right ankle pain    New Prescriptions New Prescriptions   NAPROXEN (NAPROSYN) 375 MG TABLET    Take 1 tablet (375 mg total) by mouth 2 (two) times daily.     Arthor CaptainAbigail Charnette Younkin, PA-C 12/29/15 0052    Arthor CaptainAbigail Breanna Mcdaniel, PA-C 12/29/15 [id number]    Pricilla LovelessScott Goldston, MD 12/30/15 1719

## 2017-08-09 IMAGING — CR DG ANKLE COMPLETE 3+V*R*
3 series · 3 of 3 positions shown · non-contrast
Comparison: None.

CLINICAL DATA: Right ankle pain at the lateral malleolus after a
fall today.

EXAM:
RIGHT ANKLE - COMPLETE 3+ VIEW

[x ankle ap right]
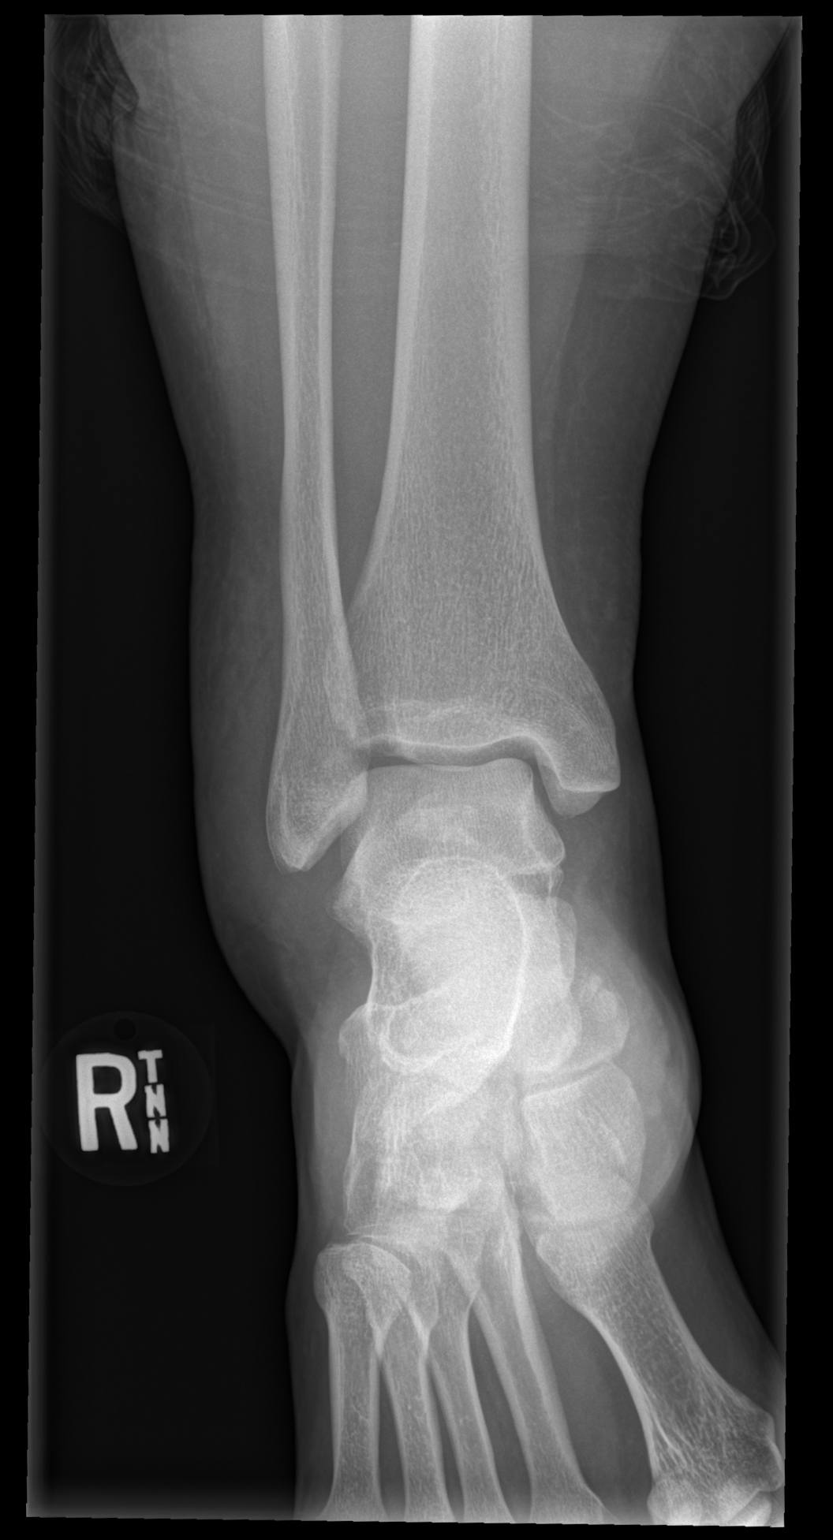

[x ankle obl right]
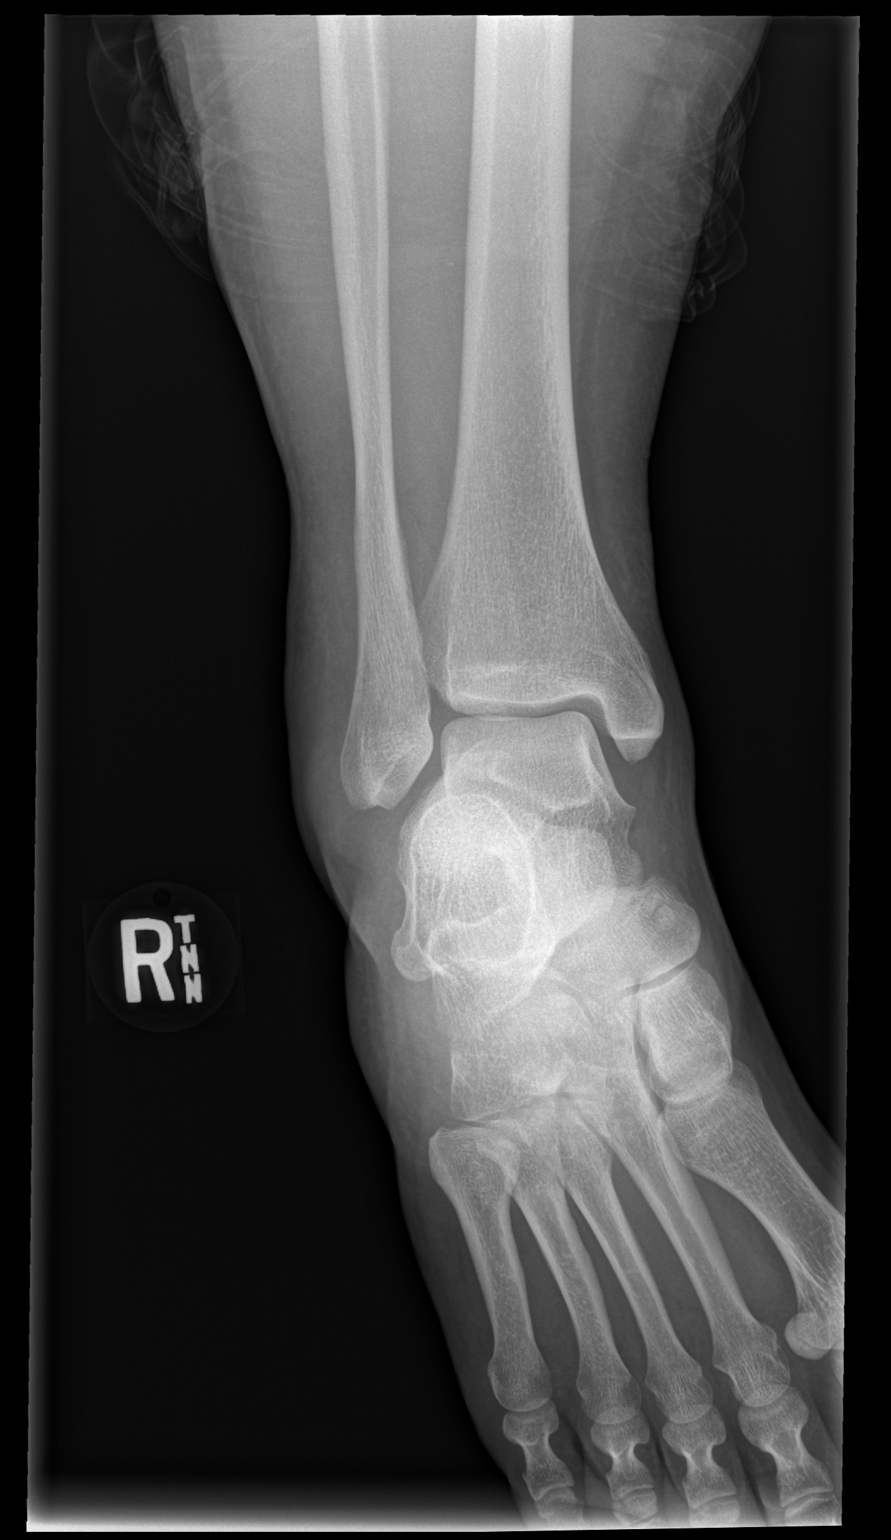

[x ankle lat right]
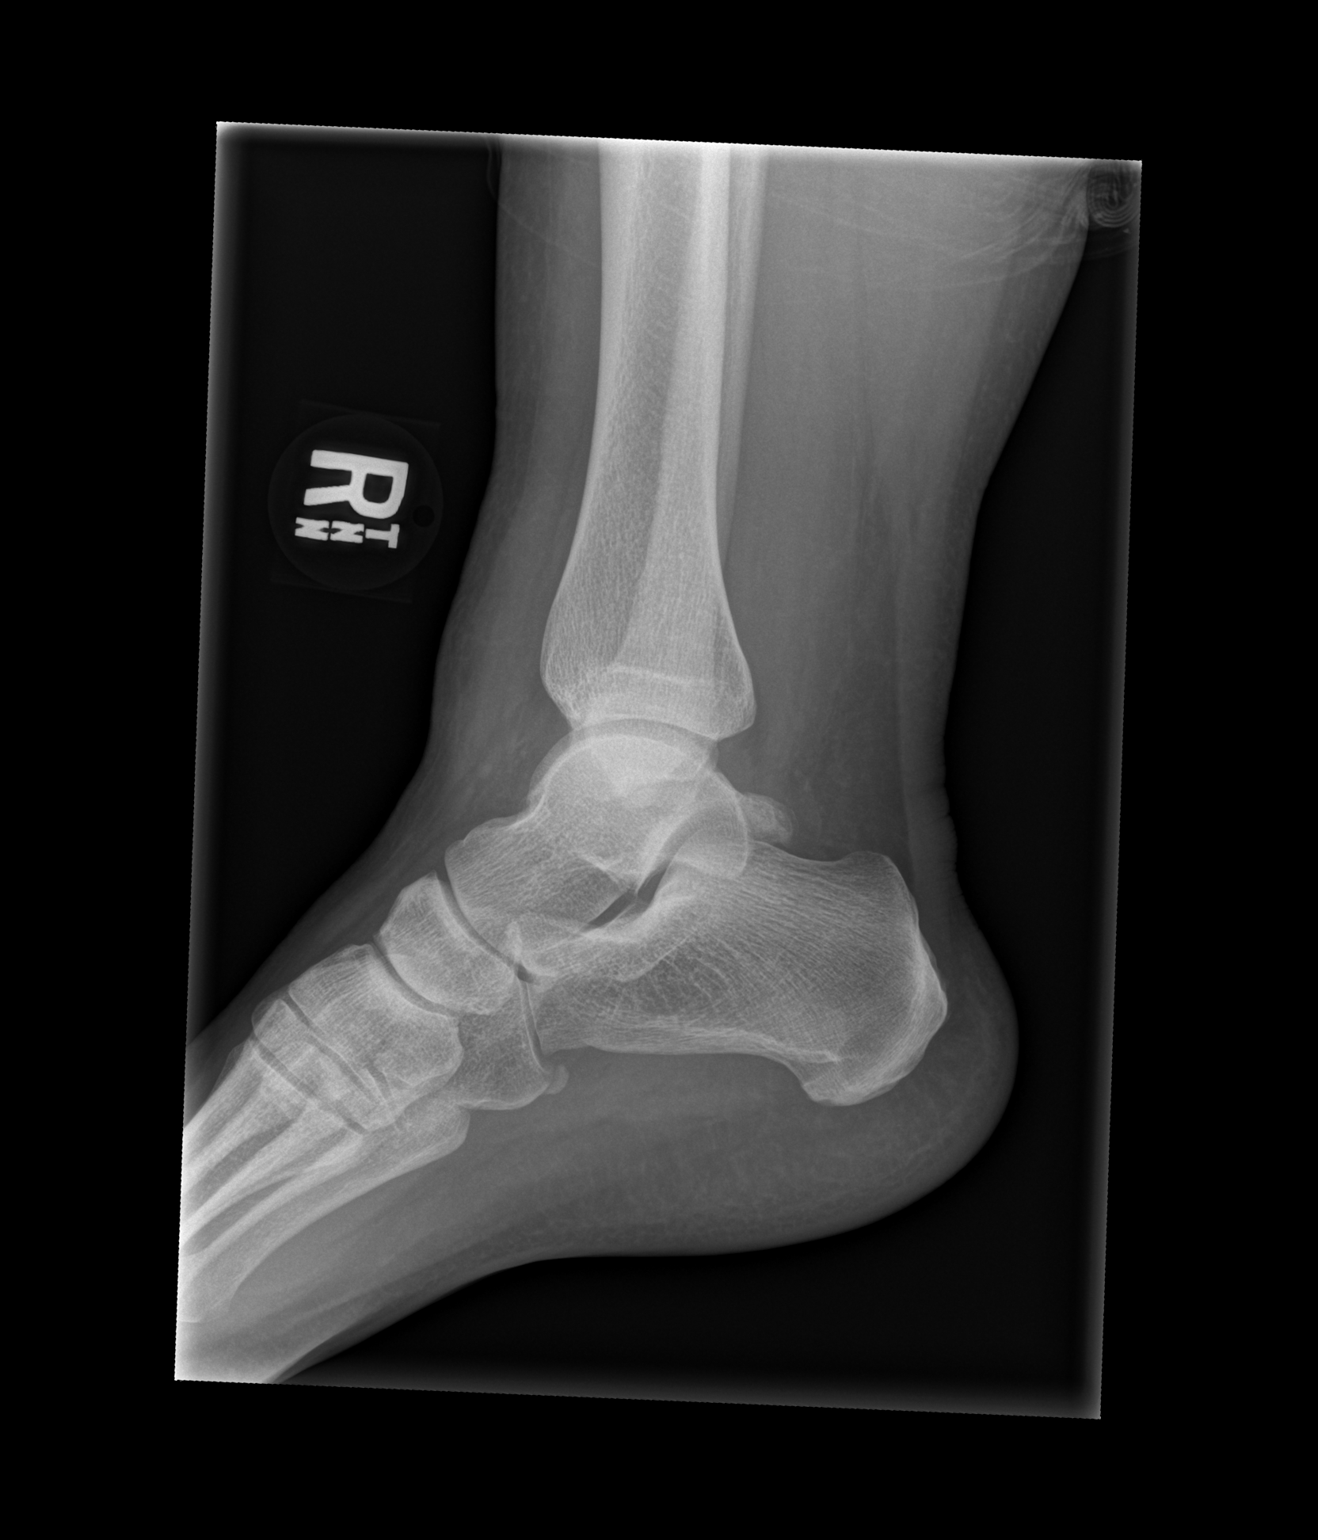

[3 of 3 positions shown; findings below may reference images not displayed]

FINDINGS: Lateral soft tissue swelling about the right ankle. No evidence of
acute fracture or dislocation. Ankle mortise and talar dome appear
intact. No focal bone lesion or bone destruction.
IMPRESSION: Lateral soft tissue swelling about the right ankle. No acute bony
abnormalities.

## 2022-09-28 ENCOUNTER — Other Ambulatory Visit: Payer: Self-pay | Admitting: Family Medicine

## 2022-09-28 ENCOUNTER — Other Ambulatory Visit: Payer: Self-pay | Admitting: *Deleted

## 2022-09-28 DIAGNOSIS — Z1231 Encounter for screening mammogram for malignant neoplasm of breast: Secondary | ICD-10-CM

## 2022-10-12 ENCOUNTER — Ambulatory Visit
Admission: RE | Admit: 2022-10-12 | Discharge: 2022-10-12 | Disposition: A | Discharge status: Home / Self Care | Payer: BC Managed Care – PPO | Source: Ambulatory Visit

## 2022-10-12 DIAGNOSIS — Z1231 Encounter for screening mammogram for malignant neoplasm of breast: Secondary | ICD-10-CM

## 2022-10-17 ENCOUNTER — Ambulatory Visit: Payer: BC Managed Care – PPO | Admitting: Family Medicine

## 2022-10-17 ENCOUNTER — Encounter: Payer: Self-pay | Admitting: Family Medicine

## 2022-10-17 VITALS — BP 112/80 | HR 70 | Temp 98.2°F | Ht 66.5 in | Wt 215.0 lb

## 2022-10-17 DIAGNOSIS — R41840 Attention and concentration deficit: Secondary | ICD-10-CM

## 2022-10-17 DIAGNOSIS — Z23 Encounter for immunization: Secondary | ICD-10-CM

## 2022-10-17 DIAGNOSIS — Z13220 Encounter for screening for lipoid disorders: Secondary | ICD-10-CM | POA: Diagnosis not present

## 2022-10-17 DIAGNOSIS — Z1159 Encounter for screening for other viral diseases: Secondary | ICD-10-CM | POA: Diagnosis not present

## 2022-10-17 NOTE — Progress Notes (Unsigned)
New Patient Office Visit  Subjective    Patient ID: Megan [name], female    DOB: Jul 20, 1980  Age: 42 y.o. MRN: [id number]  CC:  Chief Complaint  Patient presents with   Establish Care    HPI Megan [name] presents to establish care Patient reports she did not have a previous PCP, she reports that she hasn't seen a PCP in some time and needed to "get checked out" and get up to date on her health. States that she did just recently have a mammogram which I reviewed and it was normal.  Pt reports she has a history of anxiety/depression as a teenager and was on celexa for a while. States that her symptoms appear to be controlled. States that her sister was recently diagnosed with ADHD and was never diagnosed as a child. States that when she was a kid she was always told she was smart but had trouble "reaching her potential". States that she did struggle while going through school, took longer to get through education, but did well overall. Has great difficulty maintaining her attention and focus. States that her daughter's therapist is having her daughter evaluated.   I have reviewed all aspects of the patient's medical history including social, family, and surgical history.   Current Outpatient Medications  Medication Instructions   famotidine (PEPCID AC) 10 mg, Oral, 2 times daily PRN   naproxen sodium (ALEVE) 220 mg, Oral,  Once    Past Medical History:  Diagnosis Date   Distal radius fracture, right    Heartburn     Past Surgical History:  Procedure Laterality Date   APPENDECTOMY     CESAREAN SECTION     DENTAL SURGERY     OPEN REDUCTION INTERNAL FIXATION (ORIF) DISTAL RADIAL FRACTURE Right 03/21/2014   Procedure: OPEN REDUCTION INTERNAL FIXATION (ORIF) DISTAL RADIAL FRACTURE;  Surgeon: Eldred Manges, MD;  Location: [facility] OR;  Service: Orthopedics;  Laterality: Right;    Family History  Problem Relation Age of Onset   Hypertension Mother    Kidney disease Father     Arthritis Father    Diabetes Father     Social History   Socioeconomic History   Marital status: Media planner    Spouse name: Not on file   Number of children: Not on file   Years of education: Not on file   Highest education level: Master's degree (e.g., MA, MS, MEng, MEd, MSW, MBA)  Occupational History   Not on file  Tobacco Use   Smoking status: Former    Types: Cigarettes   Smokeless tobacco: Never   Tobacco comments:    Quit December 2015  Vaping Use   Vaping status: Never Used  Substance and Sexual Activity   Alcohol use: Yes    Comment: rarely   Drug use: Not Currently    Types: Marijuana   Sexual activity: Yes    Birth control/protection: Condom  Other Topics Concern   Not on file  Social History Narrative   Not on file   Social Determinants of Health   Financial Resource Strain: Low Risk  (10/12/2022)   Overall Financial Resource Strain (CARDIA)    Difficulty of Paying Living Expenses: Not very hard  Food Insecurity: No Food Insecurity (10/12/2022)   Hunger Vital Sign    Worried About Running Out of Food in the Last Year: Never true    Ran Out of Food in the Last Year: Never true  Transportation Needs: No Transportation Needs (  10/12/2022)   PRAPARE - Administrator, Civil Service (Medical): No    Lack of Transportation (Non-Medical): No  Physical Activity: Insufficiently Active (10/12/2022)   Exercise Vital Sign    Days of Exercise per Week: 3 days    Minutes of Exercise per Session: 30 min  Stress: No Stress Concern Present (10/12/2022)   [employer] of Occupational Health - Occupational Stress Questionnaire    Feeling of Stress : Only a little  Social Connections: Moderately Isolated (10/12/2022)   Social Connection and Isolation Panel [NHANES]    Frequency of Communication with Friends and Family: More than three times a week    Frequency of Social Gatherings with Friends and Family: Twice a week    Attends Religious Services:  Never    Database administrator or Organizations: No    Attends Engineer, structural: Not on file    Marital Status: Living with partner  Intimate Partner Violence: Not on file    Review of Systems  Constitutional:  Negative for chills and fever.  Neurological:  Negative for headaches.  Psychiatric/Behavioral:  Positive for memory loss. Negative for depression. The patient is nervous/anxious.   All other systems reviewed and are negative.     Objective    BP 112/80 (BP Location: Left Arm, Patient Position: Sitting, Cuff Size: Large)   Pulse 70   Temp 98.2 F (36.8 C) (Oral)   Ht 5' 6.5" (1.689 m)   Wt 215 lb (97.5 kg)   LMP 09/20/2022 (Exact Date)   SpO2 99%   BMI 34.18 kg/m   Physical Exam Vitals reviewed.  Constitutional:      Appearance: Normal appearance. She is obese.  Neurological:     Mental Status: She is alert.     {Labs (Optional):23779}    Assessment & Plan:  Need for hepatitis C screening test -     Hepatitis C antibody; Future  Need for lipid screening -     Lipid panel; Future  Difficulty concentrating -     TSH; Future -     CBC with Differential/Platelet; Future -     Comprehensive metabolic panel; Future  Immunization due -     Tdap vaccine greater than or equal to 7yo IM    No follow-ups on file.   Karie Georges, MD

## 2022-10-18 DIAGNOSIS — R41840 Attention and concentration deficit: Secondary | ICD-10-CM | POA: Insufficient documentation

## 2022-10-18 NOTE — Assessment & Plan Note (Signed)
Could definitely have a diagnosis of ADHD given the symptoms she is reporting today. She has a family history and many symptoms are endorsed in the HPI. Will check labs to rule out other causes of the difficulty concentrating. If labs are relatively normal then I would recommend referral for the testing.

## 2022-10-21 ENCOUNTER — Other Ambulatory Visit (INDEPENDENT_AMBULATORY_CARE_PROVIDER_SITE_OTHER): Payer: BC Managed Care – PPO

## 2022-10-21 DIAGNOSIS — Z1159 Encounter for screening for other viral diseases: Secondary | ICD-10-CM

## 2022-10-21 DIAGNOSIS — Z13220 Encounter for screening for lipoid disorders: Secondary | ICD-10-CM | POA: Diagnosis not present

## 2022-10-21 DIAGNOSIS — R41840 Attention and concentration deficit: Secondary | ICD-10-CM

## 2022-10-21 LAB — COMPREHENSIVE METABOLIC PANEL
ALT: 21 U/L (ref 0–35)
AST: 27 U/L (ref 0–37)
Albumin: 4.1 g/dL (ref 3.5–5.2)
Alkaline Phosphatase: 55 U/L (ref 39–117)
BUN: 18 mg/dL (ref 6–23)
CO2: 26 mEq/L (ref 19–32)
Calcium: 9.3 mg/dL (ref 8.4–10.5)
Chloride: 106 mEq/L (ref 96–112)
Creatinine, Ser: 0.94 mg/dL (ref 0.40–1.20)
GFR: 74.21 mL/min (ref >60.00)
Glucose, Bld: 83 mg/dL (ref 70–99)
Potassium: 4.2 mEq/L (ref 3.5–5.1)
Sodium: 138 mEq/L (ref 135–145)
Total Bilirubin: 0.7 mg/dL (ref 0.2–1.2)
Total Protein: 7.2 g/dL (ref 6.0–8.3)

## 2022-10-21 LAB — CBC WITH DIFFERENTIAL/PLATELET
Basophils Absolute: 0.1 10*3/uL (ref 0.0–0.1)
Basophils Relative: 0.8 % (ref 0.0–3.0)
Eosinophils Absolute: 0.3 10*3/uL (ref 0.0–0.7)
Eosinophils Relative: 4.5 % (ref 0.0–5.0)
HCT: 42.5 % (ref 36.0–46.0)
Hemoglobin: 14.3 g/dL (ref 12.0–15.0)
Lymphocytes Relative: 28 % (ref 12.0–46.0)
Lymphs Abs: 1.9 10*3/uL (ref 0.7–4.0)
MCHC: 33.6 g/dL (ref 30.0–36.0)
MCV: 89.4 fl (ref 78.0–100.0)
Monocytes Absolute: 0.5 10*3/uL (ref 0.1–1.0)
Monocytes Relative: 7.3 % (ref 3.0–12.0)
Neutro Abs: 4 10*3/uL (ref 1.4–7.7)
Neutrophils Relative %: 59.4 % (ref 43.0–77.0)
Platelets: 358 10*3/uL (ref 150.0–400.0)
RBC: 4.75 Mil/uL (ref 3.87–5.11)
RDW: 13 % (ref 11.5–15.5)
WBC: 6.7 10*3/uL (ref 4.0–10.5)

## 2022-10-21 LAB — LIPID PANEL
Cholesterol: 158 mg/dL (ref 0–200)
HDL: 38.2 mg/dL — ABNORMAL LOW (ref >39.00)
LDL Cholesterol: 100 mg/dL — ABNORMAL HIGH (ref 0–99)
NonHDL: 119.81
Total CHOL/HDL Ratio: 4
Triglycerides: 98 mg/dL (ref 0.0–149.0)
VLDL: 19.6 mg/dL (ref 0.0–40.0)

## 2022-10-21 LAB — TSH: TSH: 1.7 u[IU]/mL (ref 0.35–5.50)

## 2022-10-22 LAB — HEPATITIS C ANTIBODY: Hepatitis C Ab: NONREACTIVE

## 2022-11-16 ENCOUNTER — Ambulatory Visit: Payer: BLUE CROSS/BLUE SHIELD | Admitting: Family Medicine

## 2023-01-26 ENCOUNTER — Encounter: Payer: Self-pay | Admitting: Family Medicine

## 2023-01-26 ENCOUNTER — Ambulatory Visit: Payer: BC Managed Care – PPO | Admitting: Family Medicine

## 2023-01-26 ENCOUNTER — Other Ambulatory Visit (HOSPITAL_COMMUNITY)
Admission: RE | Admit: 2023-01-26 | Discharge: 2023-01-26 | Disposition: A | Discharge status: Home / Self Care | Payer: BC Managed Care – PPO | Source: Ambulatory Visit | Attending: Family Medicine | Admitting: Family Medicine

## 2023-01-26 VITALS — BP 132/88 | HR 89 | Temp 97.9°F | Ht 66.0 in | Wt 217.9 lb

## 2023-01-26 DIAGNOSIS — Z124 Encounter for screening for malignant neoplasm of cervix: Secondary | ICD-10-CM

## 2023-01-26 DIAGNOSIS — Z0000 Encounter for general adult medical examination without abnormal findings: Secondary | ICD-10-CM | POA: Diagnosis not present

## 2023-01-26 NOTE — Patient Instructions (Addendum)
 Debrox ear drops -- 2-3 times per week  Health Maintenance, Female Adopting a healthy lifestyle and getting preventive care are important in promoting health and wellness. Ask your health care provider about: The right schedule for you to have regular tests and exams. Things you can do on your own to prevent diseases and keep yourself healthy. What should I know about diet, weight, and exercise? Eat a healthy diet  Eat a diet that includes plenty of vegetables, fruits, low-fat dairy products, and lean protein. Do not eat a lot of foods that are high in solid fats, added sugars, or sodium. Maintain a healthy weight Body mass index (BMI) is used to identify weight problems. It estimates body fat based on height and weight. Your health care provider can help determine your BMI and help you achieve or maintain a healthy weight. Get regular exercise Get regular exercise. This is one of the most important things you can do for your health. Most adults should: Exercise for at least 150 minutes each week. The exercise should increase your heart rate and make you sweat (moderate-intensity exercise). Do strengthening exercises at least twice a week. This is in addition to the moderate-intensity exercise. Spend less time sitting. Even light physical activity can be beneficial. Watch cholesterol and blood lipids Have your blood tested for lipids and cholesterol at 44 years of age, then have this test every 5 years. Have your cholesterol levels checked more often if: Your lipid or cholesterol levels are high. You are older than 44 years of age. You are at high risk for heart disease. What should I know about cancer screening? Depending on your health history and family history, you may need to have cancer screening at various ages. This may include screening for: Breast cancer. Cervical cancer. Colorectal cancer. Skin cancer. Lung cancer. What should I know about heart disease, diabetes, and high  blood pressure? Blood pressure and heart disease High blood pressure causes heart disease and increases the risk of stroke. This is more likely to develop in people who have high blood pressure readings or are overweight. Have your blood pressure checked: Every 3-5 years if you are 34-99 years of age. Every year if you are 45 years old or older. Diabetes Have regular diabetes screenings. This checks your fasting blood sugar level. Have the screening done: Once every three years after age 6 if you are at a normal weight and have a low risk for diabetes. More often and at a younger age if you are overweight or have a high risk for diabetes. What should I know about preventing infection? Hepatitis B If you have a higher risk for hepatitis B, you should be screened for this virus. Talk with your health care provider to find out if you are at risk for hepatitis B infection. Hepatitis C Testing is recommended for: Everyone born from [birth year] through 1965. Anyone with known risk factors for hepatitis C. Sexually transmitted infections (STIs) Get screened for STIs, including gonorrhea and chlamydia, if: You are sexually active and are younger than 44 years of age. You are older than 44 years of age and your health care provider tells you that you are at risk for this type of infection. Your sexual activity has changed since you were last screened, and you are at increased risk for chlamydia or gonorrhea. Ask your health care provider if you are at risk. Ask your health care provider about whether you are at high risk for HIV. Your health care provider  may recommend a prescription medicine to help prevent HIV infection. If you choose to take medicine to prevent HIV, you should first get tested for HIV. You should then be tested every 3 months for as long as you are taking the medicine. Pregnancy If you are about to stop having your period (premenopausal) and you may become pregnant, seek counseling  before you get pregnant. Take 400 to 800 micrograms (mcg) of folic acid every day if you become pregnant. Ask for birth control (contraception) if you want to prevent pregnancy. Osteoporosis and menopause Osteoporosis is a disease in which the bones lose minerals and strength with aging. This can result in bone fractures. If you are 88 years old or older, or if you are at risk for osteoporosis and fractures, ask your health care provider if you should: Be screened for bone loss. Take a calcium or vitamin D supplement to lower your risk of fractures. Be given hormone replacement therapy (HRT) to treat symptoms of menopause. Follow these instructions at home: Alcohol use Do not drink alcohol if: Your health care provider tells you not to drink. You are pregnant, may be pregnant, or are planning to become pregnant. If you drink alcohol: Limit how much you have to: 0-1 drink a day. Know how much alcohol is in your drink. In the [place] one drink equals one 12 oz bottle of beer (355 mL), one 5 oz glass of wine (148 mL), or one 1 oz glass of hard liquor (44 mL). Lifestyle Do not use any products that contain nicotine or tobacco. These products include cigarettes, chewing tobacco, and vaping devices, such as e-cigarettes. If you need help quitting, ask your health care provider. Do not use street drugs. Do not share needles. Ask your health care provider for help if you need support or information about quitting drugs. General instructions Schedule regular health, dental, and eye exams. Stay current with your vaccines. Tell your health care provider if: You often feel depressed. You have ever been abused or do not feel safe at home. Summary Adopting a healthy lifestyle and getting preventive care are important in promoting health and wellness. Follow your health care provider's instructions about healthy diet, exercising, and getting tested or screened for diseases. Follow your health care  provider's instructions on monitoring your cholesterol and blood pressure. This information is not intended to replace advice given to you by your health care provider. Make sure you discuss any questions you have with your health care provider. Document Revised: 06/29/2020 Document Reviewed: 06/29/2020 Elsevier Patient Education  2024 [employer].

## 2023-01-26 NOTE — Progress Notes (Signed)
Complete physical exam  Patient: Megan [name]   DOB: [birth year]   42 y.o. Female  MRN: [id number]  Subjective:    Chief Complaint  Patient presents with   Annual Exam    Megan [name] is a 42 y.o. female who presents today for a complete physical exam. She reports consuming a general diet. Gym/ health club routine includes light weights. She generally feels well. She reports sleeping well. She does not have additional problems to discuss today.    Most recent fall risk assessment:     No data to display           Most recent depression screenings:    10/17/2022    3:08 PM  PHQ 2/9 Scores  PHQ - 2 Score 0  PHQ- 9 Score 3    Vision:Within last year and Dental: No current dental problems and No regular dental care pt was advised to schedule  Patient Active Problem List   Diagnosis Date Noted   Difficulty concentrating 10/18/2022   Distal radius fracture, right 03/21/2014      Patient Care Team: Karie Georges, MD as PCP - General (Family Medicine)   Outpatient Medications Prior to Visit  Medication Sig   famotidine (PEPCID AC) 10 MG chewable tablet Chew 10 mg by mouth 2 (two) times daily as needed for heartburn.   Multiple Vitamin (MULTIVITAMIN) tablet Take 1 tablet by mouth daily.   naproxen sodium (ANAPROX) 220 MG tablet Take 220 mg by mouth once.   Omega-3 Fatty Acids (OMEGA 3 PO) Take by mouth.   No facility-administered medications prior to visit.    Review of Systems  HENT:  Negative for hearing loss.   Eyes:  Negative for blurred vision.  Respiratory:  Negative for shortness of breath.   Cardiovascular:  Negative for chest pain.  Gastrointestinal: Negative.   Genitourinary: Negative.   Musculoskeletal:  Negative for back pain.  Neurological:  Negative for headaches.  Psychiatric/Behavioral:  Negative for depression.        Objective:     BP 132/88 (BP Location: Left Arm, Patient Position: Sitting, Cuff Size: Large)   Pulse  89   Temp 97.9 F (36.6 C) (Oral)   Ht 5\' 6"  (1.676 m)   Wt 217 lb 14.4 oz (98.8 kg)   LMP 01/05/2023 (Approximate)   SpO2 99%   BMI 35.17 kg/m    Physical Exam Vitals reviewed. Exam conducted with a chaperone present.  Constitutional:      Appearance: She is normal weight.  Cardiovascular:     Rate and Rhythm: Normal rate and regular rhythm.     Pulses: Normal pulses.     Heart sounds: Normal heart sounds.  Pulmonary:     Effort: Pulmonary effort is normal.     Breath sounds: Normal breath sounds.  Chest:     Chest wall: No mass.  Breasts:    Tanner Score is 5.     Right: Normal. No mass or tenderness.     Left: Normal. No mass or tenderness.  Abdominal:     General: Abdomen is flat. Bowel sounds are normal.     Palpations: Abdomen is soft.  Genitourinary:    General: Normal vulva.     Exam position: Lithotomy position.     Tanner stage (genital): 5.     Vagina: Normal.     Cervix: Normal.     Uterus: Normal.      Adnexa: Right adnexa normal and left adnexa normal.  Rectum: Normal.  Lymphadenopathy:     Upper Body:     Right upper body: No axillary adenopathy.     Left upper body: No axillary adenopathy.  Neurological:     General: No focal deficit present.     Mental Status: She is alert and oriented to person, place, and time.  Psychiatric:        Mood and Affect: Mood and affect normal.      Results for orders placed or performed in visit on 01/26/23  Cytology - PAP  Result Value Ref Range   High risk HPV Negative    Adequacy      Satisfactory for evaluation; transformation zone component PRESENT.   Diagnosis      - Negative for intraepithelial lesion or malignancy (NILM)   Comment Normal Reference Range HPV - Negative        Assessment & Plan:    Routine Health Maintenance and Physical Exam  Immunization History  Administered Date(s) Administered   Influenza, Seasonal, Injecte, Preservative Fre 10/17/2022   PFIZER(Purple Top)SARS-COV-2  Vaccination 05/01/2019, 05/21/2019   Tdap 10/17/2022    Health Maintenance  Topic Date Due   COVID-19 Vaccine (3 - 2023-24 season) 02/11/2023 (Originally 10/23/2022)   Cervical Cancer Screening (HPV/Pap Cotest)  01/26/2028   DTaP/Tdap/Td (2 - Td or Tdap) 10/16/2032   INFLUENZA VACCINE  Completed   Hepatitis C Screening  Completed   HIV Screening  Completed   HPV VACCINES  Aged Out    Discussed health benefits of physical activity, and encouraged her to engage in regular exercise appropriate for her age and condition.  Routine general medical examination at a health care facility  Cervical cancer screening -     Cytology - PAP  Normal physical exam findings today, normal wellwoman exam. Pap sent for evaluation. Handouts given on healthy eating and exercise.   Return in about 1 year (around 01/26/2024) for annual physical exam.     Karie Georges, MD

## 2023-01-27 LAB — CYTOLOGY - PAP
Comment: NEGATIVE
Diagnosis: NEGATIVE
High risk HPV: NEGATIVE

## 2023-08-07 ENCOUNTER — Encounter: Payer: Self-pay | Admitting: Family Medicine

## 2023-12-12 ENCOUNTER — Encounter: Payer: Self-pay | Admitting: Family Medicine

## 2023-12-12 ENCOUNTER — Ambulatory Visit: Admitting: Family Medicine

## 2023-12-12 VITALS — BP 118/80 | HR 90 | Temp 98.2°F | Ht 66.0 in | Wt 191.0 lb

## 2023-12-12 DIAGNOSIS — N3001 Acute cystitis with hematuria: Secondary | ICD-10-CM | POA: Diagnosis not present

## 2023-12-12 LAB — POC URINALSYSI DIPSTICK (AUTOMATED)
Bilirubin, UA: NEGATIVE
Blood, UA: NEGATIVE
Glucose, UA: NEGATIVE
Ketones, UA: NEGATIVE
Nitrite, UA: POSITIVE
Protein, UA: NEGATIVE
Spec Grav, UA: <=1.005 — AB (ref 1.010–1.025)
Urobilinogen, UA: 1 U/dL
pH, UA: 8.5 — AB (ref 5.0–8.0)

## 2023-12-12 MED ORDER — SULFAMETHOXAZOLE-TRIMETHOPRIM 800-160 MG PO TABS: 1.0000 | ORAL_TABLET | Freq: Two times a day (BID) | ORAL | 0 refills | Status: AC

## 2023-12-12 NOTE — Progress Notes (Signed)
 Established Patient Office Visit  Subjective   Patient ID: Analy [name], female    DOB: 1980-11-07  Age: 43 y.o. MRN: [id number]  Chief Complaint  Patient presents with   Urinary Frequency    Patient complains of urinary frequency and burning x1 day, tried AZO with relief    Urinary Frequency  This is a new problem. The current episode started yesterday. The problem occurs every urination. The problem has been gradually worsening. The quality of the pain is described as burning. The pain is at a severity of 4/10. The pain is mild. There has been no fever. She is Sexually active. There is No history of pyelonephritis. Associated symptoms include frequency and urgency. She has tried nothing for the symptoms.     Current Outpatient Medications  Medication Instructions   famotidine (PEPCID AC) 10 mg, 2 times daily PRN   lisdexamfetamine (VYVANSE) 30 mg, Daily   Multiple Vitamin (MULTIVITAMIN) tablet 1 tablet, Daily   naproxen  sodium (ALEVE ) 220 mg,  Once   Omega-3 Fatty Acids (OMEGA 3 PO) Take by mouth.   sulfamethoxazole-trimethoprim (BACTRIM DS) 800-160 MG tablet 1 tablet, Oral, 2 times daily    Patient Active Problem List   Diagnosis Date Noted   Difficulty concentrating 10/18/2022   Distal radius fracture, right 03/21/2014     Review of Systems  Genitourinary:  Positive for frequency and urgency.      Objective:     BP 118/80   Pulse 90   Temp 98.2 F (36.8 C) (Oral)   Ht 5' 6 (1.676 m)   Wt 191 lb (86.6 kg)   LMP 11/29/2023 (Exact Date)   SpO2 98%   BMI 30.83 kg/m    Physical Exam Vitals reviewed.  Constitutional:      Appearance: Normal appearance. She is obese.  Cardiovascular:     Rate and Rhythm: Normal rate and regular rhythm.     Heart sounds: Normal heart sounds.  Pulmonary:     Effort: Pulmonary effort is normal.  Abdominal:     General: Bowel sounds are normal.     Tenderness: There is no right CVA tenderness or left CVA tenderness.   Neurological:     Mental Status: She is alert and oriented to person, place, and time.      Results for orders placed or performed in visit on 12/12/23  POCT Urinalysis Dipstick (Automated)  Result Value Ref Range   Color, UA orange    Clarity, UA cloudy    Glucose, UA Negative Negative   Bilirubin, UA negative    Ketones, UA negative    Spec Grav, UA <=1.005 (A) 1.010 - 1.025   Blood, UA negative    pH, UA 8.5 (A) 5.0 - 8.0   Protein, UA Negative Negative   Urobilinogen, UA 1.0 0.2 or 1.0 E.U./dL   Nitrite, UA positive    Leukocytes, UA Small (1+) (A) Negative      The 10-year ASCVD risk score (Arnett DK, et al., 2019) is: 0.8%    Assessment & Plan:  Acute cystitis with hematuria -     POCT Urinalysis Dipstick (Automated) -     Urine Culture; Future -     Sulfamethoxazole-Trimethoprim; Take 1 tablet by mouth 2 (two) times daily for 5 days.  Dispense: 10 tablet; Refill: 0   UA in office is positive for UTI, pt reports she had a UTI last month and was given macrobid at that time which seemed to have worked, however now  her sx have returned. Will treat with bactrim this time and send her urine to culture to confirm sensitivities.   No follow-ups on file.    Heron CHRISTELLA Sharper, MD

## 2023-12-15 ENCOUNTER — Ambulatory Visit: Payer: Self-pay | Admitting: Family Medicine

## 2023-12-15 LAB — URINE CULTURE
MICRO NUMBER:: 17127245
SPECIMEN QUALITY:: ADEQUATE

## 2023-12-15 MED ORDER — CIPROFLOXACIN HCL 500 MG PO TABS: 500.0000 mg | ORAL_TABLET | Freq: Two times a day (BID) | ORAL | 0 refills | Status: AC

## 2023-12-18 NOTE — Telephone Encounter (Signed)
 Yes ok

## 2023-12-25 ENCOUNTER — Other Ambulatory Visit: Payer: Self-pay | Admitting: Family Medicine

## 2023-12-25 DIAGNOSIS — Z1231 Encounter for screening mammogram for malignant neoplasm of breast: Secondary | ICD-10-CM

## 2024-01-01 ENCOUNTER — Ambulatory Visit: Admitting: Family Medicine

## 2024-01-01 ENCOUNTER — Encounter: Payer: Self-pay | Admitting: Family Medicine

## 2024-01-01 VITALS — BP 102/72 | HR 76 | Temp 98.3°F | Ht 66.0 in | Wt 193.2 lb

## 2024-01-01 DIAGNOSIS — R350 Frequency of micturition: Secondary | ICD-10-CM

## 2024-01-01 DIAGNOSIS — N3001 Acute cystitis with hematuria: Secondary | ICD-10-CM | POA: Diagnosis not present

## 2024-01-01 LAB — POC URINALSYSI DIPSTICK (AUTOMATED)
Bilirubin, UA: NEGATIVE
Glucose, UA: NEGATIVE
Ketones, UA: NEGATIVE
Nitrite, UA: NEGATIVE
Protein, UA: POSITIVE — AB
Spec Grav, UA: 1.015 (ref 1.010–1.025)
Urobilinogen, UA: 0.2 U/dL
pH, UA: 6 (ref 5.0–8.0)

## 2024-01-01 MED ORDER — CIPROFLOXACIN HCL 500 MG PO TABS: 500.0000 mg | ORAL_TABLET | Freq: Two times a day (BID) | ORAL | 0 refills | Status: AC

## 2024-01-01 NOTE — Progress Notes (Signed)
 Established Patient Office Visit  Subjective   Patient ID: Megan [name], female    DOB: 1980/09/03  Age: 43 y.o. MRN: [id number]  Chief Complaint  Patient presents with   Urinary Frequency    Recurrent x4 days    Urinary Frequency  Associated symptoms include frequency.   Grew E. Coli 10/21, was given bactrim initially however it was resistant so she was switched to cipro 500 mg BID for 5 days, that's that her symptoms did get better however the urinary frequency remains. No fever or chills.  Discussed the use of AI scribe software for clinical note transcription with the patient, who gave verbal consent to proceed.  History of Present Illness   Megan [name] is a 43 year old female who presents with persistent urinary symptoms following treatment for a urinary tract infection.  She completed a five-day course of ciprofloxacin 500 mg, which initially improved her symptoms. She continues to experience urgency and frequent urination, with severity decreasing from a 'seven' to a 'three'. Urgency is the most prominent symptom, accompanied by burning sensations. She recently finished her menstrual period. No fever, chills, or vaginal discharge.  A recent urine analysis showed three plus blood and small leukocytes, but negative nitrites.       Current Outpatient Medications  Medication Instructions   ciprofloxacin (CIPRO) 500 mg, Oral, 2 times daily   famotidine (PEPCID AC) 10 mg, 2 times daily PRN   lisdexamfetamine (VYVANSE) 30 mg, Daily   Multiple Vitamin (MULTIVITAMIN) tablet 1 tablet, Daily   naproxen  sodium (ALEVE ) 220 mg,  Once   Omega-3 Fatty Acids (OMEGA 3 PO) Take by mouth.    Patient Active Problem List   Diagnosis Date Noted   Difficulty concentrating 10/18/2022   Distal radius fracture, right 03/21/2014     Review of Systems  Genitourinary:  Positive for frequency.  All other systems reviewed and are negative.     Objective:     BP 102/72    Pulse 76   Temp 98.3 F (36.8 C) (Oral)   Ht 5' 6 (1.676 m)   Wt 193 lb 3.2 oz (87.6 kg)   LMP 11/29/2023 (Exact Date)   SpO2 97%   BMI 31.18 kg/m    Physical Exam Vitals reviewed.  Constitutional:      Appearance: Normal appearance. She is normal weight.  Eyes:     Conjunctiva/sclera: Conjunctivae normal.  Cardiovascular:     Rate and Rhythm: Normal rate and regular rhythm.  Neurological:     Mental Status: She is alert and oriented to person, place, and time.  Psychiatric:        Mood and Affect: Mood normal.        Behavior: Behavior normal.      Results for orders placed or performed in visit on 01/01/24  POCT Urinalysis Dipstick (Automated)  Result Value Ref Range   Color, UA yellow    Clarity, UA cloudy    Glucose, UA Negative Negative   Bilirubin, UA negative    Ketones, UA negative    Spec Grav, UA 1.015 1.010 - 1.025   Blood, UA 3+    pH, UA 6.0 5.0 - 8.0   Protein, UA Positive (A) Negative   Urobilinogen, UA 0.2 0.2 or 1.0 E.U./dL   Nitrite, UA negative    Leukocytes, UA Small (1+) (A) Negative      The 10-year ASCVD risk score (Arnett DK, et al., 2019) is: 0.6%    Assessment & Plan:  Urinary frequency -     POCT Urinalysis Dipstick (Automated) -     Urine Culture  Acute cystitis with hematuria -     Ciprofloxacin HCl; Take 1 tablet (500 mg total) by mouth 2 (two) times daily for 5 days.  Dispense: 10 tablet; Refill: 0   Assessment and Plan    Acute cystitis with hematuria and urinary frequency Persistent symptoms of urinary frequency and urgency despite a 5-day course of ciprofloxacin. Symptoms improved from a severity of 7 to 3, but hematuria and leukocytes persist. Urine culture pending to assess for resistant bacteria. Differential includes unresolved UTI or possible yeast infection post-antibiotic treatment. No fever, chills, or vaginal discharge. Recent menstruation may contribute to hematuria. Previous UTI treated with Macrobid was  ineffective. Limited antibiotic options due to resistance patterns. Ciprofloxacin remains effective against previously identified bacteria. - Prescribed ciprofloxacin for an additional 5 days. - Ordered repeat urine culture. - Advised taking probiotics or consuming Active Culture yogurt daily during antibiotic course. - If urine culture is negative and symptoms improve, will consider discontinuing ciprofloxacin. - If symptoms persist or culture is positive, will reassess treatment options.        No follow-ups on file.    Megan CHRISTELLA Sharper, MD

## 2024-01-02 LAB — URINE CULTURE
MICRO NUMBER:: 17213647
Result:: NO GROWTH
SPECIMEN QUALITY:: ADEQUATE

## 2024-01-03 ENCOUNTER — Ambulatory Visit: Payer: Self-pay | Admitting: Family Medicine

## 2024-01-22 ENCOUNTER — Ambulatory Visit: Admission: RE | Admit: 2024-01-22 | Discharge: 2024-01-22 | Disposition: A | Source: Ambulatory Visit

## 2024-01-22 DIAGNOSIS — Z1231 Encounter for screening mammogram for malignant neoplasm of breast: Secondary | ICD-10-CM

## 2024-01-25 ENCOUNTER — Ambulatory Visit: Payer: Self-pay | Admitting: Family Medicine

## 2024-01-26 ENCOUNTER — Other Ambulatory Visit: Payer: Self-pay | Admitting: Family Medicine

## 2024-01-26 DIAGNOSIS — R928 Other abnormal and inconclusive findings on diagnostic imaging of breast: Secondary | ICD-10-CM

## 2024-01-29 ENCOUNTER — Encounter: Payer: Self-pay | Admitting: Family Medicine

## 2024-01-29 ENCOUNTER — Ambulatory Visit: Admitting: Family Medicine

## 2024-01-29 ENCOUNTER — Other Ambulatory Visit: Payer: Self-pay | Admitting: Medical Genetics

## 2024-01-29 VITALS — BP 116/78 | HR 87 | Temp 97.7°F | Ht 66.0 in | Wt 190.2 lb

## 2024-01-29 DIAGNOSIS — Z23 Encounter for immunization: Secondary | ICD-10-CM

## 2024-01-29 DIAGNOSIS — Z0000 Encounter for general adult medical examination without abnormal findings: Secondary | ICD-10-CM

## 2024-01-29 DIAGNOSIS — Z13220 Encounter for screening for lipoid disorders: Secondary | ICD-10-CM

## 2024-01-29 DIAGNOSIS — Z1211 Encounter for screening for malignant neoplasm of colon: Secondary | ICD-10-CM

## 2024-01-29 LAB — COMPREHENSIVE METABOLIC PANEL WITH GFR
ALT: 17 U/L (ref 0–35)
AST: 16 U/L (ref 0–37)
Albumin: 4.5 g/dL (ref 3.5–5.2)
Alkaline Phosphatase: 51 U/L (ref 39–117)
BUN: 15 mg/dL (ref 6–23)
CO2: 26 meq/L (ref 19–32)
Calcium: 9.5 mg/dL (ref 8.4–10.5)
Chloride: 104 meq/L (ref 96–112)
Creatinine, Ser: 0.74 mg/dL (ref 0.40–1.20)
GFR: 98.01 mL/min (ref >60.00)
Glucose, Bld: 102 mg/dL — ABNORMAL HIGH (ref 70–99)
Potassium: 4.1 meq/L (ref 3.5–5.1)
Sodium: 139 meq/L (ref 135–145)
Total Bilirubin: 0.7 mg/dL (ref 0.2–1.2)
Total Protein: 7.6 g/dL (ref 6.0–8.3)

## 2024-01-29 LAB — CBC WITH DIFFERENTIAL/PLATELET
Basophils Absolute: 0.1 K/uL (ref 0.0–0.1)
Basophils Relative: 1.1 % (ref 0.0–3.0)
Eosinophils Absolute: 0.1 K/uL (ref 0.0–0.7)
Eosinophils Relative: 2.2 % (ref 0.0–5.0)
HCT: 43.8 % (ref 36.0–46.0)
Hemoglobin: 15 g/dL (ref 12.0–15.0)
Lymphocytes Relative: 23.7 % (ref 12.0–46.0)
Lymphs Abs: 1.6 K/uL (ref 0.7–4.0)
MCHC: 34.4 g/dL (ref 30.0–36.0)
MCV: 87.2 fl (ref 78.0–100.0)
Monocytes Absolute: 0.3 K/uL (ref 0.1–1.0)
Monocytes Relative: 5.3 % (ref 3.0–12.0)
Neutro Abs: 4.5 K/uL (ref 1.4–7.7)
Neutrophils Relative %: 67.7 % (ref 43.0–77.0)
Platelets: 358 K/uL (ref 150.0–400.0)
RBC: 5.02 Mil/uL (ref 3.87–5.11)
RDW: 13.2 % (ref 11.5–15.5)
WBC: 6.6 K/uL (ref 4.0–10.5)

## 2024-01-29 LAB — LIPID PANEL
Cholesterol: 179 mg/dL (ref 0–200)
HDL: 47.2 mg/dL (ref >39.00)
LDL Cholesterol: 116 mg/dL — ABNORMAL HIGH (ref 0–99)
NonHDL: 132.13
Total CHOL/HDL Ratio: 4
Triglycerides: 82 mg/dL (ref 0.0–149.0)
VLDL: 16.4 mg/dL (ref 0.0–40.0)

## 2024-01-29 NOTE — Progress Notes (Signed)
 Complete physical exam  Patient: Megan [name]   DOB: 02-21-1981   43 y.o. Female  MRN: [id number]  Subjective:    Chief Complaint  Patient presents with   Annual Exam    Megan [name] is a 43 y.o. female who presents today for a complete physical exam. She reports consuming a general diet. Pt admits that she hasn't eaten very well the past, has a garden and eats a lot of fruits and vegetables. Eats good sources of protein, eats a lot of salmon, doesn't eat much red meat. The patient does not participate in regular exercise at present. She generally feels well. She reports sleeping fairly well. She does not have additional problems to discuss today.    Most recent fall risk assessment:     No data to display           Most recent depression screenings:    01/29/2024    8:07 AM 01/01/2024    9:23 AM  PHQ 2/9 Scores  PHQ - 2 Score 0 1  PHQ- 9 Score 1 3    Vision:Within last year and Dental: No current dental problems and Receives regular dental care  Patient Active Problem List   Diagnosis Date Noted   Difficulty concentrating 10/18/2022   Distal radius fracture, right 03/21/2014      Patient Care Team: Ozell Heron HERO, MD as PCP - General (Family Medicine)   Outpatient Medications Prior to Visit  Medication Sig   famotidine (PEPCID AC) 10 MG chewable tablet Chew 10 mg by mouth 2 (two) times daily as needed for heartburn.   lisdexamfetamine (VYVANSE) 30 MG capsule Take 30 mg by mouth daily.   Multiple Vitamin (MULTIVITAMIN) tablet Take 1 tablet by mouth daily.   naproxen  sodium (ANAPROX ) 220 MG tablet Take 220 mg by mouth once.   Omega-3 Fatty Acids (OMEGA 3 PO) Take by mouth.   No facility-administered medications prior to visit.    Review of Systems  HENT:  Negative for hearing loss.   Eyes:  Negative for blurred vision.  Respiratory:  Negative for shortness of breath.   Cardiovascular:  Negative for chest pain.  Gastrointestinal: Negative.    Genitourinary: Negative.   Musculoskeletal:  Negative for back pain.  Neurological:  Negative for headaches.  Psychiatric/Behavioral:  Negative for depression.        Objective:     BP 116/78   Pulse 87   Temp 97.7 F (36.5 C) (Oral)   Ht 5' 6 (1.676 m)   Wt 190 lb 3.2 oz (86.3 kg)   LMP 01/24/2024 (Exact Date)   SpO2 97%   BMI 30.70 kg/m    Physical Exam Vitals reviewed.  Constitutional:      Appearance: Normal appearance. She is well-groomed and normal weight.  HENT:     Right Ear: Tympanic membrane and ear canal normal.     Left Ear: Tympanic membrane and ear canal normal.     Mouth/Throat:     Mouth: Mucous membranes are moist.     Pharynx: No posterior oropharyngeal erythema.  Eyes:     Conjunctiva/sclera: Conjunctivae normal.  Neck:     Thyroid : No thyromegaly.  Cardiovascular:     Rate and Rhythm: Normal rate and regular rhythm.     Pulses: Normal pulses.     Heart sounds: S1 normal and S2 normal.  Pulmonary:     Effort: Pulmonary effort is normal.     Breath sounds: Normal breath sounds and air  entry.  Abdominal:     General: Abdomen is flat. Bowel sounds are normal.     Palpations: Abdomen is soft.  Musculoskeletal:     Right lower leg: No edema.     Left lower leg: No edema.  Lymphadenopathy:     Cervical: No cervical adenopathy.  Neurological:     Mental Status: She is alert and oriented to person, place, and time. Mental status is at baseline.     Gait: Gait is intact.  Psychiatric:        Mood and Affect: Mood and affect normal.        Speech: Speech normal.        Behavior: Behavior normal.        Judgment: Judgment normal.      No results found for any visits on 01/29/24.     Assessment & Plan:    Routine Health Maintenance and Physical Exam  Immunization History  Administered Date(s) Administered   Influenza, Seasonal, Injecte, Preservative Fre 10/17/2022   PFIZER(Purple Top)SARS-COV-2 Vaccination 05/01/2019, 05/21/2019    Tdap 10/17/2022    Health Maintenance  Topic Date Due   Hepatitis B Vaccines 19-59 Average Risk (1 of 3 - 19+ 3-dose series) Never done   HPV VACCINES (1 - 3-dose SCDM series) Never done   COVID-19 Vaccine (4 - 2025-26 season) 10/23/2023   Influenza Vaccine  05/21/2024 (Originally 09/22/2023)   Mammogram  01/21/2026   Cervical Cancer Screening (HPV/Pap Cotest)  01/26/2028   DTaP/Tdap/Td (2 - Td or Tdap) 10/16/2032   Hepatitis C Screening  Completed   HIV Screening  Completed   Pneumococcal Vaccine  Aged Out   Meningococcal B Vaccine  Aged Out    Discussed health benefits of physical activity, and encouraged her to engage in regular exercise appropriate for her age and condition.  Routine general medical examination at a health care facility -     CBC with Differential/Platelet; Future -     Comprehensive metabolic panel with GFR; Future  Lipid screening -     Lipid panel; Future  Colon cancer screening -     Ambulatory referral to Gastroenterology  Immunization due -     Flu vaccine trivalent PF, 6mos and older(Flulaval,Afluria,Fluarix,Fluzone)   General physical exam findings are normal today. I reviewed the patient's preventative testing, immunizations, and lifestyle habits. I made appropriate recommendations and placed orders for the appropriate tests and/or vaccinations. I counseled the patient on the CDC's recommendations for healthy exercise and diet. I counseled the patient on healthy sleep habits and stress management. Handouts to reinforce the counseling were given at the conclusion of the visit.    Return in 1 year (on 01/28/2025).     Heron CHRISTELLA Sharper, MD

## 2024-01-29 NOTE — Patient Instructions (Addendum)
 Pumpkin seed oil - 3000 mg daily Saw Palmetto at least 300 mg daily  Health Maintenance, Female Adopting a healthy lifestyle and getting preventive care are important in promoting health and wellness. Ask your health care provider about: The right schedule for you to have regular tests and exams. Things you can do on your own to prevent diseases and keep yourself healthy. What should I know about diet, weight, and exercise? Eat a healthy diet  Eat a diet that includes plenty of vegetables, fruits, low-fat dairy products, and lean protein. Do not eat a lot of foods that are high in solid fats, added sugars, or sodium. Maintain a healthy weight Body mass index (BMI) is used to identify weight problems. It estimates body fat based on height and weight. Your health care provider can help determine your BMI and help you achieve or maintain a healthy weight. Get regular exercise Get regular exercise. This is one of the most important things you can do for your health. Most adults should: Exercise for at least 150 minutes each week. The exercise should increase your heart rate and make you sweat (moderate-intensity exercise). Do strengthening exercises at least twice a week. This is in addition to the moderate-intensity exercise. Spend less time sitting. Even light physical activity can be beneficial. Watch cholesterol and blood lipids Have your blood tested for lipids and cholesterol at 45 years of age, then have this test every 5 years. Have your cholesterol levels checked more often if: Your lipid or cholesterol levels are high. You are older than 45 years of age. You are at high risk for heart disease. What should I know about cancer screening? Depending on your health history and family history, you may need to have cancer screening at various ages. This may include screening for: Breast cancer. Cervical cancer. Colorectal cancer. Skin cancer. Lung cancer. What should I know about heart  disease, diabetes, and high blood pressure? Blood pressure and heart disease High blood pressure causes heart disease and increases the risk of stroke. This is more likely to develop in people who have high blood pressure readings or are overweight. Have your blood pressure checked: Every 3-5 years if you are 45-72 years of age. Every year if you are 12 years old or older. Diabetes Have regular diabetes screenings. This checks your fasting blood sugar level. Have the screening done: Once every three years after age 62 if you are at a normal weight and have a low risk for diabetes. More often and at a younger age if you are overweight or have a high risk for diabetes. What should I know about preventing infection? Hepatitis B If you have a higher risk for hepatitis B, you should be screened for this virus. Talk with your health care provider to find out if you are at risk for hepatitis B infection. Hepatitis C Testing is recommended for: Everyone born from [birth year] through 1967. Anyone with known risk factors for hepatitis C. Sexually transmitted infections (STIs) Get screened for STIs, including gonorrhea and chlamydia, if: You are sexually active and are younger than 45 years of age. You are older than 45 years of age and your health care provider tells you that you are at risk for this type of infection. Your sexual activity has changed since you were last screened, and you are at increased risk for chlamydia or gonorrhea. Ask your health care provider if you are at risk. Ask your health care provider about whether you are at high risk  for HIV. Your health care provider may recommend a prescription medicine to help prevent HIV infection. If you choose to take medicine to prevent HIV, you should first get tested for HIV. You should then be tested every 3 months for as long as you are taking the medicine. Pregnancy If you are about to stop having your period (premenopausal) and you may become  pregnant, seek counseling before you get pregnant. Take 400 to 800 micrograms (mcg) of folic acid every day if you become pregnant. Ask for birth control (contraception) if you want to prevent pregnancy. Osteoporosis and menopause Osteoporosis is a disease in which the bones lose minerals and strength with aging. This can result in bone fractures. If you are 10 years old or older, or if you are at risk for osteoporosis and fractures, ask your health care provider if you should: Be screened for bone loss. Take a calcium  or vitamin D supplement to lower your risk of fractures. Be given hormone replacement therapy (HRT) to treat symptoms of menopause. Follow these instructions at home: Alcohol use Do not drink alcohol if: Your health care provider tells you not to drink. You are pregnant, may be pregnant, or are planning to become pregnant. If you drink alcohol: Limit how much you have to: 0-1 drink a day. Know how much alcohol is in your drink. In the [place] one drink equals one 12 oz bottle of beer (355 mL), one 5 oz glass of wine (148 mL), or one 1 oz glass of hard liquor (44 mL). Lifestyle Do not use any products that contain nicotine or tobacco. These products include cigarettes, chewing tobacco, and vaping devices, such as e-cigarettes. If you need help quitting, ask your health care provider. Do not use street drugs. Do not share needles. Ask your health care provider for help if you need support or information about quitting drugs. General instructions Schedule regular health, dental, and eye exams. Stay current with your vaccines. Tell your health care provider if: You often feel depressed. You have ever been abused or do not feel safe at home. Summary Adopting a healthy lifestyle and getting preventive care are important in promoting health and wellness. Follow your health care provider's instructions about healthy diet, exercising, and getting tested or screened for  diseases. Follow your health care provider's instructions on monitoring your cholesterol and blood pressure. This information is not intended to replace advice given to you by your health care provider. Make sure you discuss any questions you have with your health care provider. Document Revised: 06/29/2020 Document Reviewed: 06/29/2020 Elsevier Patient Education  2024 [employer].

## 2024-02-01 ENCOUNTER — Ambulatory Visit: Payer: Self-pay | Admitting: Family Medicine

## 2024-02-07 ENCOUNTER — Other Ambulatory Visit

## 2024-02-07 ENCOUNTER — Encounter

## 2024-02-09 ENCOUNTER — Other Ambulatory Visit

## 2024-02-16 ENCOUNTER — Ambulatory Visit: Payer: Self-pay | Admitting: Family Medicine

## 2024-02-16 ENCOUNTER — Ambulatory Visit

## 2024-02-16 ENCOUNTER — Ambulatory Visit
Admission: RE | Admit: 2024-02-16 | Discharge: 2024-02-16 | Disposition: A | Discharge status: Home / Self Care | Source: Ambulatory Visit | Attending: Family Medicine | Admitting: Family Medicine

## 2024-02-16 DIAGNOSIS — R928 Other abnormal and inconclusive findings on diagnostic imaging of breast: Secondary | ICD-10-CM

## 2024-03-25 ENCOUNTER — Encounter: Payer: Self-pay | Admitting: Gastroenterology
